# Patient Record
Sex: Female | Born: 1950
Health system: Southern US, Community
[De-identification: ages and names within clinical notes are randomized; demographics above are authoritative.]

## PROBLEM LIST (undated history)

## (undated) DIAGNOSIS — I8393 Asymptomatic varicose veins of bilateral lower extremities: Secondary | ICD-10-CM

## (undated) DIAGNOSIS — M48061 Spinal stenosis, lumbar region without neurogenic claudication: Secondary | ICD-10-CM

## (undated) DIAGNOSIS — I10 Essential (primary) hypertension: Secondary | ICD-10-CM

## (undated) DIAGNOSIS — H269 Unspecified cataract: Secondary | ICD-10-CM

## (undated) DIAGNOSIS — M1990 Unspecified osteoarthritis, unspecified site: Secondary | ICD-10-CM

## (undated) DIAGNOSIS — G5600 Carpal tunnel syndrome, unspecified upper limb: Secondary | ICD-10-CM

## (undated) DIAGNOSIS — F329 Major depressive disorder, single episode, unspecified: Secondary | ICD-10-CM

## (undated) HISTORY — DX: Essential (primary) hypertension: I10

## (undated) HISTORY — DX: Asymptomatic varicose veins of bilateral lower extremities: I83.93

## (undated) HISTORY — DX: Unspecified osteoarthritis, unspecified site: M19.90

## (undated) HISTORY — DX: Unspecified cataract: H26.9

## (undated) HISTORY — DX: Major depressive disorder, single episode, unspecified: F32.9

## (undated) HISTORY — DX: Carpal tunnel syndrome, unspecified upper limb: G56.00

## (undated) HISTORY — PX: CATARACT EXTRACTION: SUR2

## (undated) HISTORY — DX: Spinal stenosis, lumbar region without neurogenic claudication: M48.061

---

## 2002-12-03 ENCOUNTER — Encounter: Payer: Self-pay | Admitting: Emergency Medicine

## 2002-12-03 ENCOUNTER — Emergency Department (HOSPITAL_COMMUNITY): Admission: EM | Admit: 2002-12-03 | Discharge: 2002-12-03 | Payer: Self-pay | Admitting: Podiatry

## 2002-12-27 ENCOUNTER — Ambulatory Visit (HOSPITAL_COMMUNITY): Admission: RE | Admit: 2002-12-27 | Discharge: 2002-12-27 | Payer: Self-pay | Admitting: *Deleted

## 2003-01-20 ENCOUNTER — Encounter: Admission: RE | Admit: 2003-01-20 | Discharge: 2003-01-20 | Payer: Self-pay | Admitting: Family Medicine

## 2003-03-11 ENCOUNTER — Encounter: Admission: RE | Admit: 2003-03-11 | Discharge: 2003-03-11 | Payer: Self-pay | Admitting: Family Medicine

## 2003-04-07 ENCOUNTER — Emergency Department (HOSPITAL_COMMUNITY): Admission: EM | Admit: 2003-04-07 | Discharge: 2003-04-07 | Payer: Self-pay | Admitting: Emergency Medicine

## 2003-04-10 ENCOUNTER — Encounter: Admission: RE | Admit: 2003-04-10 | Discharge: 2003-05-01 | Payer: Self-pay | Admitting: Neurosurgery

## 2004-09-29 ENCOUNTER — Encounter: Admission: RE | Admit: 2004-09-29 | Discharge: 2004-09-29 | Payer: Self-pay | Admitting: Family Medicine

## 2004-10-07 ENCOUNTER — Encounter: Admission: RE | Admit: 2004-10-07 | Discharge: 2004-10-07 | Payer: Self-pay | Admitting: Family Medicine

## 2004-10-09 ENCOUNTER — Other Ambulatory Visit: Admission: RE | Admit: 2004-10-09 | Discharge: 2004-10-09 | Payer: Self-pay | Admitting: Family Medicine

## 2009-02-08 ENCOUNTER — Emergency Department (HOSPITAL_COMMUNITY): Admission: EM | Admit: 2009-02-08 | Discharge: 2009-02-08 | Payer: Self-pay | Admitting: Emergency Medicine

## 2009-02-12 ENCOUNTER — Encounter: Admission: RE | Admit: 2009-02-12 | Discharge: 2009-02-12 | Payer: Self-pay | Admitting: Chiropractic Medicine

## 2010-03-01 ENCOUNTER — Encounter: Payer: Self-pay | Admitting: Family Medicine

## 2015-02-28 ENCOUNTER — Ambulatory Visit: Payer: Medicaid Other | Attending: Family Medicine | Admitting: Family Medicine

## 2015-02-28 ENCOUNTER — Encounter: Payer: Self-pay | Admitting: Family Medicine

## 2015-02-28 VITALS — BP 157/90 | HR 73 | Temp 97.8°F | Resp 18 | Ht 61.0 in | Wt 186.0 lb

## 2015-02-28 DIAGNOSIS — M7660 Achilles tendinitis, unspecified leg: Secondary | ICD-10-CM | POA: Diagnosis not present

## 2015-02-28 DIAGNOSIS — I8390 Asymptomatic varicose veins of unspecified lower extremity: Secondary | ICD-10-CM | POA: Insufficient documentation

## 2015-02-28 DIAGNOSIS — G5600 Carpal tunnel syndrome, unspecified upper limb: Secondary | ICD-10-CM | POA: Diagnosis not present

## 2015-02-28 DIAGNOSIS — M25551 Pain in right hip: Secondary | ICD-10-CM | POA: Diagnosis not present

## 2015-02-28 DIAGNOSIS — I10 Essential (primary) hypertension: Secondary | ICD-10-CM

## 2015-02-28 DIAGNOSIS — I83893 Varicose veins of bilateral lower extremities with other complications: Secondary | ICD-10-CM | POA: Insufficient documentation

## 2015-02-28 DIAGNOSIS — G5601 Carpal tunnel syndrome, right upper limb: Secondary | ICD-10-CM | POA: Diagnosis not present

## 2015-02-28 DIAGNOSIS — M4802 Spinal stenosis, cervical region: Secondary | ICD-10-CM | POA: Diagnosis not present

## 2015-02-28 DIAGNOSIS — R51 Headache: Secondary | ICD-10-CM | POA: Insufficient documentation

## 2015-02-28 DIAGNOSIS — M48061 Spinal stenosis, lumbar region without neurogenic claudication: Secondary | ICD-10-CM

## 2015-02-28 DIAGNOSIS — I8393 Asymptomatic varicose veins of bilateral lower extremities: Secondary | ICD-10-CM

## 2015-02-28 DIAGNOSIS — Z131 Encounter for screening for diabetes mellitus: Secondary | ICD-10-CM | POA: Diagnosis not present

## 2015-02-28 DIAGNOSIS — F329 Major depressive disorder, single episode, unspecified: Secondary | ICD-10-CM | POA: Diagnosis not present

## 2015-02-28 DIAGNOSIS — M4806 Spinal stenosis, lumbar region: Secondary | ICD-10-CM | POA: Diagnosis not present

## 2015-02-28 DIAGNOSIS — F32A Depression, unspecified: Secondary | ICD-10-CM

## 2015-02-28 DIAGNOSIS — M7662 Achilles tendinitis, left leg: Secondary | ICD-10-CM

## 2015-02-28 HISTORY — DX: Carpal tunnel syndrome, unspecified upper limb: G56.00

## 2015-02-28 HISTORY — DX: Spinal stenosis, lumbar region without neurogenic claudication: M48.061

## 2015-02-28 HISTORY — DX: Depression, unspecified: F32.A

## 2015-02-28 LAB — COMPLETE METABOLIC PANEL WITH GFR
ALT: 20 U/L (ref 6–29)
AST: 19 U/L (ref 10–35)
Albumin: 4.4 g/dL (ref 3.6–5.1)
Alkaline Phosphatase: 71 U/L (ref 33–130)
BILIRUBIN TOTAL: 0.5 mg/dL (ref 0.2–1.2)
BUN: 9 mg/dL (ref 7–25)
CALCIUM: 8.7 mg/dL (ref 8.6–10.4)
CO2: 26 mmol/L (ref 20–31)
CREATININE: 0.44 mg/dL — AB (ref 0.50–0.99)
Chloride: 104 mmol/L (ref 98–110)
GFR, Est Non African American: >89 mL/min (ref >=60)
Glucose, Bld: 105 mg/dL — ABNORMAL HIGH (ref 65–99)
Potassium: 3.8 mmol/L (ref 3.5–5.3)
Sodium: 141 mmol/L (ref 135–146)
TOTAL PROTEIN: 7 g/dL (ref 6.1–8.1)

## 2015-02-28 LAB — LIPID PANEL
CHOLESTEROL: 207 mg/dL — AB (ref 125–200)
HDL: 33 mg/dL — ABNORMAL LOW (ref >=46)
LDL CALC: 125 mg/dL (ref <130)
TRIGLYCERIDES: 247 mg/dL — AB (ref <150)
Total CHOL/HDL Ratio: 6.3 Ratio — ABNORMAL HIGH (ref <=5.0)
VLDL: 49 mg/dL — ABNORMAL HIGH (ref <30)

## 2015-02-28 LAB — POCT GLYCOSYLATED HEMOGLOBIN (HGB A1C): Hemoglobin A1C: 5.5

## 2015-02-28 MED ORDER — MELOXICAM 7.5 MG PO TABS: 7.5000 mg | ORAL_TABLET | Freq: Every day | ORAL | Status: DC

## 2015-02-28 MED ORDER — TRAMADOL HCL 50 MG PO TABS: 50.0000 mg | ORAL_TABLET | Freq: Three times a day (TID) | ORAL | Status: DC | PRN

## 2015-02-28 MED ORDER — LISINOPRIL-HYDROCHLOROTHIAZIDE 20-25 MG PO TABS: 1.0000 | ORAL_TABLET | Freq: Every day | ORAL | Status: DC

## 2015-02-28 MED FILL — traMADol HCL 50 MG TABS: 50 | 10 days supply | Qty: 30 | Fill #0

## 2015-02-28 NOTE — Progress Notes (Signed)
Subjective:  Patient ID: Shelly Meyer, female    DOB: June 04, 1950  Age: 65 y.o. MRN: [id number]  CC: Establish Care   HPI Braylin Bergman is a 65 year old female with a history of hypertension, depression, carpal tunnel syndrome, spinal stenosis who comes into the clinic accompanied by her daughter to establish care. She is seen with the aid of a video interpreter. Complains of running out of her blood pressure medications for the last 1 month and has had a headache this morning which is throbbing but denies nausea or vomiting or blurry vision. She also has right hip pain which started this morning and makes it difficult to walk. Of note, medical history significant for spinal stenosis and she is not on any medications for this. She also has left posterior ankle pain which she has had for the last month.  She points to her varicose veins which she states she is not happy with.  Denies chest pains or shortness of breath.  No outpatient prescriptions prior to visit.   No facility-administered medications prior to visit.    ROS Review of Systems  Constitutional: Negative for activity change, appetite change and fatigue.  HENT: Negative for congestion, sinus pressure and sore throat.   Eyes: Negative for visual disturbance.  Respiratory: Negative for cough, chest tightness, shortness of breath and wheezing.   Cardiovascular: Negative for chest pain and palpitations.  Gastrointestinal: Negative for abdominal pain, constipation and abdominal distention.  Endocrine: Negative for polydipsia.  Genitourinary: Negative for dysuria and frequency.  Musculoskeletal:       See history of present illness  Skin: Negative for rash.  Neurological: Positive for headaches. Negative for tremors, light-headedness and numbness.  Hematological: Does not bruise/bleed easily.  Psychiatric/Behavioral: Negative for behavioral problems and agitation.    Objective:  BP 157/90 mmHg  Pulse 73  Temp(Src) 97.8  F (36.6 C) (Oral)  Resp 18  Ht 5\' 1"  (1.549 m)  Wt 186 lb (84.369 kg)  BMI 35.16 kg/m2  SpO2 96%  LMP  (LMP Unknown)  BP/Weight A999333  Systolic BP A999333  Diastolic BP 90  Wt. (Lbs) 186  BMI 35.16      Physical Exam  Constitutional: She is oriented to person, place, and time. She appears well-developed and well-nourished.  Cardiovascular: Normal rate, normal heart sounds and intact distal pulses.   No murmur heard. Pulmonary/Chest: Effort normal and breath sounds normal. She has no wheezes. She has no rales. She exhibits no tenderness.  Abdominal: Soft. Bowel sounds are normal. She exhibits no distension and no mass. There is no tenderness.  Musculoskeletal: Normal range of motion. She exhibits tenderness (tenderness on palpation of left Achilles tendon).  Tenderness on palpation of cervical and lumbosacral spine; negative straight leg raise bilaterally  Neurological: She is alert and oriented to person, place, and time.  Skin:  Varicose veins of the right leg     Assessment & Plan:   1. Screening for diabetes mellitus (DM) A1c is normal at 5.5 - HgB A1c  2. Essential hypertension Uncontrolled due to being out of her medications which could also explain headache Advised on low sodium diet - lisinopril-hydrochlorothiazide (PRINZIDE,ZESTORETIC) 20-25 MG tablet; Take 1 tablet by mouth daily.  Dispense: 90 tablet; Refill: 1 - COMPLETE METABOLIC PANEL WITH GFR - Lipid panel  3. Spinal stenosis in lumbar region This could explain right hip pain especially from the radiculopathy We'll reassess for improvement at next office visit and determine the need to see a spine specialist -  traMADol (ULTRAM) 50 MG tablet; Take 1 tablet (50 mg total) by mouth every 8 (eight) hours as needed.  Dispense: 30 tablet; Refill: 0  4. Carpal tunnel syndrome of right wrist Stable  5. Achilles tendinitis of left lower extremity - meloxicam (MOBIC) 7.5 MG tablet; Take 1 tablet (7.5 mg  total) by mouth daily.  Dispense: 30 tablet; Refill: 0  6. Varicose vein of leg We'll discuss management and next visit that she may need surgical intervention if she so desires  7. Depression Patient refuses to take antidepressants because the one she used in the past were sedating     Meds ordered this encounter  Medications  . lisinopril-hydrochlorothiazide (PRINZIDE,ZESTORETIC) 20-25 MG tablet    Sig: Take 1 tablet by mouth daily.    Dispense:  90 tablet    Refill:  1  . traMADol (ULTRAM) 50 MG tablet    Sig: Take 1 tablet (50 mg total) by mouth every 8 (eight) hours as needed.    Dispense:  30 tablet    Refill:  0  . meloxicam (MOBIC) 7.5 MG tablet    Sig: Take 1 tablet (7.5 mg total) by mouth daily.    Dispense:  30 tablet    Refill:  0    Follow-up: Return in about 3 weeks (around 03/21/2015), or if symptoms worsen or fail to improve, for Follow-up of hypertension and varicose veins.Arnoldo Morale MD

## 2015-02-28 NOTE — Patient Instructions (Signed)
Tendinitis de Aquiles (Achilles Tendinitis) La tendinitis de Aquiles es una inflamacin de la banda dura, con aspecto de cordn, que [facility] inferiores de la pierna con el taln (tendn de Aquiles). Tiene su origen en el uso excesivo de ese tendn y la articulacin involucrada.  CAUSAS La tendinitis de Aquiles puede ocurrir por los siguientes motivos:  Un aumento repentino del ejercicio fsico o la actividad (como correr).  La repeticin Ardelia Mems y otra vez de los mismos ejercicios o las actividades (Network engineer).  No precalentar los msculos de las pantorrillas antes de hacer ejercicio.  Hacer ejercicio con calzado que est desgastado o no es adecuado para este fin.  Tener artritis o una formacin sea en la parte posterior del hueso del taln, que puede rozar contra el tendn y Insurance underwriter. [place] Rocker SNTOMAS Los sntomas ms frecuentes son:  Aeronautical engineer parte posterior de la pierna, justo arriba del taln. Generalmente, el dolor empeora con el ejercicio y mejora con el reposo.  Rigidez o dolor en la parte posterior de la pierna, en especial por la maana.  Hinchazn de la piel sobre el tendn de Aquiles.  Dificultades para pararse en puntas de pie. A veces, el tendn de Aquiles se desgarra (se rompe). Los sntomas de ruptura del tendn de Aquiles pueden incluir lo siguiente:  Dolor intenso y repentino en la parte posterior de la pierna.  Dificultad para soportar el peso en el pie o caminar normalmente. DIAGNSTICO El diagnstico de tendinitis de Aquiles se har en funcin de los sntomas y de un examen fsico. Se puede hacer una radiografa para verificar si hay otra afeccin que est causando los sntomas. Se puede indicar una RM si su mdico sospecha que hay una rotura total del tendn, lo que se conoce como ruptura del tendn de Aquiles.  TRATAMIENTO  Generalmente, la tendinitis de Aquiles mejora con [employer]. La recuperacin total puede llevar semanas o meses. El  tratamiento se centra en tratar los sntomas y ayudar a la curacin de la lesin. INSTRUCCIONES PARA EL CUIDADO EN EL HOGAR   Ponga en reposo el tendn de Aquiles y evite las actividades que causan dolor.  Aplique hielo sobre la zona lesionada.  Ponga el hielo en una bolsa plstica.  Colquese una toalla entre la piel y la bolsa de hielo.  Deje el hielo durante 20 minutos, y aplquelo de 2 a 3 veces por Training and development officer.  Mientras el tendn le cause dolor, evite moverlo (todo rango de movimiento que no sea moderado). No reinicie los movimientos hasta que su mdico se lo indique. Comience gradualmente. No aumente los movimientos hasta el punto de Education officer, environmental. Si el dolor aparece, disminuya la actividad y contine con las medidas indicadas ms [place]. Aumente gradualmente las actividades que no le causan Office manager la actividad normal.  Haga ejercicios para fortalecer y Garment/textile technologist la flexibilidad de los msculos de la pantorrilla. Su mdico o el fisioterapeuta pueden recomendarle ejercicios para que haga.  Vndese el tobillo con una venda elstica o de otro tipo. Esto puede ayudar a Corporate treasurer excesivo del tendn. Su mdico le mostrar cmo vendarse correctamente el tobillo.  Utilice los medicamentos de venta libre o recetados para Glass blower/designer, Health and safety inspector o la fiebre, segn se lo indique el mdico. SOLICITE ATENCIN MDICA SI:   El dolor y la hinchazn [place], o el dolor es incontrolable con medicamentos.  Tiene sntomas nuevos o sin motivo, o Press photographer.  No puede mover los dedos  del pie o el pie.  Tiene calor e hinchazn en el pie.  Le sube la fiebre sin motivo. ASEGRESE DE QUE:   Comprende estas instrucciones.  Controlar su afeccin.  Recibir ayuda de inmediato si no mejora o si empeora.   Esta informacin no tiene Marine scientist el consejo del mdico. Asegrese de hacerle al mdico cualquier pregunta que tenga.   Document Released: 01/25/2005  Document Revised: 02/15/2014 Elsevier Interactive Patient Education [employer].

## 2015-02-28 NOTE — Progress Notes (Signed)
Patient having right hip pain, radiates down leg, currently at level 8, described as aching, and is constant.  Patient has had headache this morning since 2am, currently at level 9, described as throbbing, sharp, and "feels like my eyes are going to pop out".    Patient reports she can't walk well because she is having pain in posterior left ankle.   Patient complains of swelling in right leg and varicose veins.    Patient reports being out of lisinopril-hctz 20/25mg  for 1 week.

## 2015-04-08 ENCOUNTER — Encounter: Payer: Self-pay | Admitting: Clinical

## 2015-04-08 NOTE — Progress Notes (Signed)
Depression screen [facility] 2/9 02/28/2015 02/28/2015  Decreased Interest 3 0  Down, Depressed, Hopeless 0 0  PHQ - 2 Score 3 0  Altered sleeping 1 -  Tired, decreased energy 3 -  Change in appetite 0 -  Feeling bad or failure about yourself  0 -  Trouble concentrating 3 -  Moving slowly or fidgety/restless 0 -  Suicidal thoughts 0 -  PHQ-9 Score 10 -    GAD 7 : Generalized Anxiety Score 02/28/2015  Nervous, Anxious, on Edge 0  Control/stop worrying 3  Worry too much - different things 0  Trouble relaxing 0  Restless 0  Easily annoyed or irritable 2  Afraid - awful might happen 0  Total GAD 7 Score 5

## 2015-07-09 ENCOUNTER — Encounter (HOSPITAL_COMMUNITY): Payer: Self-pay | Admitting: Emergency Medicine

## 2015-07-09 ENCOUNTER — Ambulatory Visit (HOSPITAL_COMMUNITY)
Admission: EM | Admit: 2015-07-09 | Discharge: 2015-07-09 | Disposition: A | Discharge status: Nursing Home (Includes ICF) | Payer: Medicare Other | Attending: Family Medicine | Admitting: Family Medicine

## 2015-07-09 ENCOUNTER — Emergency Department (HOSPITAL_COMMUNITY)
Admission: EM | Admit: 2015-07-09 | Discharge: 2015-07-10 | Disposition: A | Discharge status: Home / Self Care | Payer: Medicare Other | Attending: Emergency Medicine | Admitting: Emergency Medicine

## 2015-07-09 DIAGNOSIS — F329 Major depressive disorder, single episode, unspecified: Secondary | ICD-10-CM | POA: Diagnosis not present

## 2015-07-09 DIAGNOSIS — R109 Unspecified abdominal pain: Secondary | ICD-10-CM | POA: Insufficient documentation

## 2015-07-09 DIAGNOSIS — M549 Dorsalgia, unspecified: Secondary | ICD-10-CM | POA: Diagnosis present

## 2015-07-09 DIAGNOSIS — K5730 Diverticulosis of large intestine without perforation or abscess without bleeding: Secondary | ICD-10-CM | POA: Diagnosis not present

## 2015-07-09 DIAGNOSIS — Z79899 Other long term (current) drug therapy: Secondary | ICD-10-CM | POA: Insufficient documentation

## 2015-07-09 DIAGNOSIS — M1990 Unspecified osteoarthritis, unspecified site: Secondary | ICD-10-CM | POA: Insufficient documentation

## 2015-07-09 DIAGNOSIS — M545 Low back pain, unspecified: Secondary | ICD-10-CM

## 2015-07-09 DIAGNOSIS — Z8669 Personal history of other diseases of the nervous system and sense organs: Secondary | ICD-10-CM | POA: Diagnosis not present

## 2015-07-09 DIAGNOSIS — M542 Cervicalgia: Secondary | ICD-10-CM | POA: Insufficient documentation

## 2015-07-09 DIAGNOSIS — R531 Weakness: Secondary | ICD-10-CM | POA: Insufficient documentation

## 2015-07-09 DIAGNOSIS — M25511 Pain in right shoulder: Secondary | ICD-10-CM | POA: Insufficient documentation

## 2015-07-09 DIAGNOSIS — R52 Pain, unspecified: Secondary | ICD-10-CM

## 2015-07-09 DIAGNOSIS — I10 Essential (primary) hypertension: Secondary | ICD-10-CM | POA: Diagnosis not present

## 2015-07-09 DIAGNOSIS — R0602 Shortness of breath: Secondary | ICD-10-CM | POA: Insufficient documentation

## 2015-07-09 LAB — URINALYSIS, ROUTINE W REFLEX MICROSCOPIC
Bilirubin Urine: NEGATIVE
Glucose, UA: NEGATIVE mg/dL
Ketones, ur: NEGATIVE mg/dL
Nitrite: NEGATIVE
Protein, ur: NEGATIVE mg/dL
Specific Gravity, Urine: 1.014 (ref 1.005–1.030)
pH: 7.5 (ref 5.0–8.0)

## 2015-07-09 LAB — URINE MICROSCOPIC-ADD ON

## 2015-07-09 MED ORDER — OXYCODONE-ACETAMINOPHEN 5-325 MG PO TABS
1.0000 | ORAL_TABLET | Freq: Once | ORAL | Status: AC
  Administered 2015-07-09: 1 via ORAL
  Filled 2015-07-09: qty 1

## 2015-07-09 NOTE — ED Notes (Signed)
The patient presented to the [facility] with a complaint of left shoulder and arm and back pain x 3 weeks. The patient denied any known injuries.

## 2015-07-09 NOTE — ED Provider Notes (Signed)
CSN: [id number]     Arrival date & time 07/09/15  1956 History  By signing my name below, I, Higinio Plan, attest that this documentation has been prepared under the direction and in the presence of Delora Fuel, MD . Electronically Signed: Higinio Plan, Scribe. 07/09/2015. 11:45 PM.    Chief Complaint  Patient presents with  . Back Pain   The history is provided by the patient. A language interpreter was used.   HPI Comments:  Shelly Meyer is a 65 y.o. female with PMHx of osteoarthritis and HTN, who presents to the Emergency Department complaining of constant, gradually worsening, right flank pain with associated weakness in extremities that began yesterday. Pt states that her pain is radiating out towards her right arm, fingers, hips and right achilles tendon. Pt reports that her pain began after she tried moving a TV earlier in the week. Pt states that she caught the TV as it began to fall and felt pain in her back immediately after. Pt states that makes her pain worsened during certain movements like sitting down, walking up steps, and wiping after using the bathroom. Pt states that she has taken 650 mg Tylenol and Tramadol to alleviate her symptoms with no relief. Pt denies trouble urinating, bowel and bladder incontinence, and subjective fever.    Past Medical History  Diagnosis Date  . Osteoarthritis   . Hypertension   . Carpal tunnel syndrome 02/28/2015  . Spinal stenosis of lumbar region 02/28/2015  . Depression 02/28/2015   History reviewed. No pertinent past surgical history. No family history on file. Social History  Substance Use Topics  . Smoking status: Never Smoker   . Smokeless tobacco: None  . Alcohol Use: No   OB History    No data available     Review of Systems  Constitutional: Negative for fever.  Respiratory: Positive for shortness of breath.   Genitourinary: Positive for flank pain. Negative for difficulty urinating.  Musculoskeletal: Positive for myalgias and  back pain.  Neurological: Positive for weakness.  All other systems reviewed and are negative.  Allergies  Review of patient's allergies indicates no known allergies.  Home Medications   Prior to Admission medications   Medication Sig Start Date End Date Taking? Authorizing Provider  lisinopril-hydrochlorothiazide (PRINZIDE,ZESTORETIC) 20-25 MG tablet Take 1 tablet by mouth daily. 02/28/15   Arnoldo Morale, MD  meloxicam (MOBIC) 7.5 MG tablet Take 1 tablet (7.5 mg total) by mouth daily. 02/28/15   Arnoldo Morale, MD  traMADol (ULTRAM) 50 MG tablet Take 1 tablet (50 mg total) by mouth every 8 (eight) hours as needed. 02/28/15   Arnoldo Morale, MD   Triage Vitals: BP 139/82 mmHg  Pulse 69  Temp(Src) 98.2 F (36.8 C) (Oral)  Resp 18  SpO2 98%  LMP  (LMP Unknown) Physical Exam  Constitutional: She is oriented to person, place, and time. She appears well-developed and well-nourished.  HENT:  Head: Normocephalic and atraumatic.  Eyes: Conjunctivae and EOM are normal. Pupils are equal, round, and reactive to light. Right eye exhibits no discharge. Left eye exhibits no discharge.  Neck: Normal range of motion. Neck supple. No JVD present.  Cardiovascular: Normal rate, regular rhythm and normal heart sounds.   No murmur heard. Pulmonary/Chest: Effort normal and breath sounds normal. She has no wheezes. She has no rales. She exhibits no tenderness.  Abdominal: Soft. Bowel sounds are normal. She exhibits no distension and no mass. There is no tenderness.  Musculoskeletal: Normal range of motion. She exhibits  tenderness. She exhibits no edema.  Mild tenderness diffusely in neck FROM  Back: mild tenderness in right scapular and right costovertebral area  FROM of all joints with no apparent pain  Lymphadenopathy:    She has no cervical adenopathy.  Neurological: She is alert and oriented to person, place, and time. No cranial nerve deficit. She exhibits normal muscle tone. Coordination normal.   Skin: Skin is warm and dry. No rash noted.  Psychiatric: She has a normal mood and affect.  Nursing note and vitals reviewed.   ED Course  Procedures  DIAGNOSTIC STUDIES:  Oxygen Saturation is 98% on RA, normal by my interpretation.    COORDINATION OF CARE:  11:35 PM Discussed treatment plan with pt which includes blood tests, pain medication, and CT renal  at bedside and pt agreed to plan. Pt has been advised to follow-up with her PCP.   Labs Review Results for orders placed or performed during the hospital encounter of 07/09/15  Urinalysis, Routine w reflex microscopic- may I&O cath if menses  Result Value Ref Range   Color, Urine YELLOW YELLOW   APPearance CLEAR CLEAR   Specific Gravity, Urine 1.014 1.005 - 1.030   pH 7.5 5.0 - 8.0   Glucose, UA NEGATIVE NEGATIVE mg/dL   Hgb urine dipstick TRACE (A) NEGATIVE   Bilirubin Urine NEGATIVE NEGATIVE   Ketones, ur NEGATIVE NEGATIVE mg/dL   Protein, ur NEGATIVE NEGATIVE mg/dL   Nitrite NEGATIVE NEGATIVE   Leukocytes, UA SMALL (A) NEGATIVE  Urine microscopic-add on  Result Value Ref Range   Squamous Epithelial / LPF 0-5 (A) NONE SEEN   WBC, UA 0-5 0 - 5 WBC/hpf   RBC / HPF 0-5 0 - 5 RBC/hpf   Bacteria, UA RARE (A) NONE SEEN  Basic metabolic panel  Result Value Ref Range   Sodium 136 135 - 145 mmol/L   Potassium 3.3 (L) 3.5 - 5.1 mmol/L   Chloride 102 101 - 111 mmol/L   CO2 26 22 - 32 mmol/L   Glucose, Bld 101 (H) 65 - 99 mg/dL   BUN 10 6 - 20 mg/dL   Creatinine, Ser 0.45 0.44 - 1.00 mg/dL   Calcium 9.0 8.9 - 10.3 mg/dL   GFR calc non Af Amer >60 >60 mL/min   GFR calc Af Amer >60 >60 mL/min   Anion gap 8 5 - 15  CBC with Differential  Result Value Ref Range   WBC 7.4 4.0 - 10.5 K/uL   RBC 4.51 3.87 - 5.11 MIL/uL   Hemoglobin 13.2 12.0 - 15.0 g/dL   HCT 39.7 36.0 - 46.0 %   MCV 88.0 78.0 - 100.0 fL   MCH 29.3 26.0 - 34.0 pg   MCHC 33.2 30.0 - 36.0 g/dL   RDW 12.4 11.5 - 15.5 %   Platelets 246 150 - 400 K/uL    Neutrophils Relative % 47 %   Neutro Abs 3.5 1.7 - 7.7 K/uL   Lymphocytes Relative 43 %   Lymphs Abs 3.2 0.7 - 4.0 K/uL   Monocytes Relative 7 %   Monocytes Absolute 0.5 0.1 - 1.0 K/uL   Eosinophils Relative 3 %   Eosinophils Absolute 0.2 0.0 - 0.7 K/uL   Basophils Relative 0 %   Basophils Absolute 0.0 0.0 - 0.1 K/uL  Sedimentation rate  Result Value Ref Range   Sed Rate 18 0 - 22 mm/hr   Imaging Review Ct Renal Stone Study  07/10/2015  CLINICAL DATA:  Subacute onset of right  lower back pain. Initial encounter. EXAM: CT ABDOMEN AND PELVIS WITHOUT CONTRAST TECHNIQUE: Multidetector CT imaging of the abdomen and pelvis was performed following the standard protocol without IV contrast. COMPARISON:  CT of the abdomen and pelvis performed 04/07/2003 FINDINGS: Minimal bibasilar atelectasis is noted. Scattered coronary artery calcification is noted. The liver and spleen are unremarkable in appearance. The gallbladder is within normal limits. The pancreas and adrenal glands are unremarkable. The kidneys are unremarkable in appearance. There is no evidence of hydronephrosis. No renal or ureteral stones are seen. No perinephric stranding is appreciated. No free fluid is identified. The small bowel is unremarkable in appearance. The stomach is within normal limits. No acute vascular abnormalities are seen. Scattered calcification is seen along the abdominal aorta and its branches. The appendix is normal in caliber and contains air, without evidence of appendicitis. Scattered diverticulosis is noted along the proximal sigmoid colon, without evidence of diverticulitis. The bladder is relatively decompressed and not well assessed. The uterus is grossly unremarkable in appearance. The ovaries are relatively symmetric. No suspicious adnexal masses are seen. No inguinal lymphadenopathy is seen. No acute osseous abnormalities are identified. IMPRESSION: 1. No acute abnormality seen within the abdomen or pelvis. 2.  Scattered coronary artery calcification noted. 3. Scattered calcification along the abdominal aorta and its branches. 4. Scattered diverticulosis along the proximal sigmoid colon, without evidence of diverticulitis. Electronically Signed   By: Garald Balding M.D.   On: 07/10/2015 02:11   I have personally reviewed and evaluated these images and lab results as part of my medical decision-making.   MDM   Final diagnoses:  Body aches    Body aches in pattern that is atypical. Her major complaint seem to be around the right flank, so she will be sent for renal stone protocol CT scan. Also, will screen for polymyalgia rheumatica with sedimentation rate. She's given a dose of oxycodone have acetaminophen. Old records are reviewed and she was noted to have gone to urgent care center earlier today. CT abdomen and pelvis in 2005 showed no evidence of abdominal aneurysm. Lumbar spine films in 2011 were significant only for osteoporosis. CT scan today showed no acute process and no evidence of renal calculi or hydronephrosis. Laboratory workup was unremarkable. Sedimentation rate came back at 18, which is normal and not consistent with polymyalgia rheumatica. She is given reassurance of the negative workup and is sent home with prescription for oxycodone-acetaminophen.She is to follow-up with her PCP.  I personally performed the services described in this documentation, which was scribed in my presence. The recorded information has been reviewed and is accurate.      Delora Fuel, MD [id number]

## 2015-07-09 NOTE — ED Notes (Signed)
C/o R lower back pain x 3 weeks that is worse with movement.  Also reports urine smells like "ammonia."

## 2015-07-09 NOTE — ED Provider Notes (Signed)
CSN: [id number]     Arrival date & time 07/09/15  [id number] History   First MD Initiated Contact with Patient 07/09/15 1925     Chief Complaint  Patient presents with  . Shoulder Pain  . Back Pain   (Consider location/radiation/quality/duration/timing/severity/associated sxs/prior Treatment) Patient is a 65 y.o. female presenting with back pain. The history is provided by the patient.  Back Pain Location:  Lumbar spine Quality:  Burning (right low back pain, epigastric pain, headaches.) Radiates to:  Does not radiate Pain severity:  Moderate Onset quality:  Gradual Duration:  3 weeks Progression:  Worsening Chronicity:  New Ineffective treatments: no relief from tramadol. Associated symptoms: abdominal pain and headaches   Associated symptoms: no abdominal swelling, no chest pain, no fever and no leg pain     Past Medical History  Diagnosis Date  . Osteoarthritis   . Hypertension   . Carpal tunnel syndrome 02/28/2015  . Spinal stenosis of lumbar region 02/28/2015  . Depression 02/28/2015   History reviewed. No pertinent past surgical history. History reviewed. No pertinent family history. Social History  Substance Use Topics  . Smoking status: Never Smoker   . Smokeless tobacco: None  . Alcohol Use: No   OB History    No data available     Review of Systems  Constitutional: Negative.  Negative for fever.  Cardiovascular: Negative for chest pain.  Gastrointestinal: Positive for abdominal pain.  Genitourinary: Negative.   Musculoskeletal: Positive for myalgias and back pain. Negative for joint swelling and gait problem.  Skin: Negative.   Neurological: Positive for headaches.  All other systems reviewed and are negative.   Allergies  Review of patient's allergies indicates no known allergies.  Home Medications   Prior to Admission medications   Medication Sig Start Date End Date Taking? Authorizing Provider  lisinopril-hydrochlorothiazide (PRINZIDE,ZESTORETIC)  20-25 MG tablet Take 1 tablet by mouth daily. 02/28/15  Yes Arnoldo Morale, MD  meloxicam (MOBIC) 7.5 MG tablet Take 1 tablet (7.5 mg total) by mouth daily. 02/28/15   Arnoldo Morale, MD  traMADol (ULTRAM) 50 MG tablet Take 1 tablet (50 mg total) by mouth every 8 (eight) hours as needed. 02/28/15   Arnoldo Morale, MD   Meds Ordered and Administered this Visit  Medications - No data to display  BP 151/76 mmHg  Pulse 71  Temp(Src) 98.4 F (36.9 C) (Oral)  Resp 16  SpO2 100%  LMP  (LMP Unknown) No data found.   Physical Exam  Constitutional: She is oriented to person, place, and time. She appears well-developed and well-nourished.  Eyes: Pupils are equal, round, and reactive to light.  Neck: Normal range of motion. Neck supple.  Cardiovascular: Normal rate, normal heart sounds and intact distal pulses.   Pulmonary/Chest: Effort normal and breath sounds normal.  Musculoskeletal: Normal range of motion. She exhibits tenderness.  Lumbar back and right arm pain  Lymphadenopathy:    She has no cervical adenopathy.  Neurological: She is alert and oriented to person, place, and time. No cranial nerve deficit.  Skin: Skin is warm and dry.  Nursing note and vitals reviewed.   ED Course  Procedures (including critical care time)  Labs Review Labs Reviewed - No data to display  Imaging Review No results found.   Visual Acuity Review  Right Eye Distance:   Left Eye Distance:   Bilateral Distance:    Right Eye Near:   Left Eye Near:    Bilateral Near:  MDM   1. Acute lumbar back pain    Sent for eval of complaints.    Billy Fischer, MD 07/09/15 [phone]

## 2015-07-10 ENCOUNTER — Emergency Department (HOSPITAL_COMMUNITY): Payer: Medicare Other

## 2015-07-10 DIAGNOSIS — K5730 Diverticulosis of large intestine without perforation or abscess without bleeding: Secondary | ICD-10-CM | POA: Diagnosis not present

## 2015-07-10 DIAGNOSIS — M25511 Pain in right shoulder: Secondary | ICD-10-CM | POA: Diagnosis not present

## 2015-07-10 LAB — BASIC METABOLIC PANEL
ANION GAP: 8 (ref 5–15)
BUN: 10 mg/dL (ref 6–20)
CHLORIDE: 102 mmol/L (ref 101–111)
CO2: 26 mmol/L (ref 22–32)
Calcium: 9 mg/dL (ref 8.9–10.3)
Creatinine, Ser: 0.45 mg/dL (ref 0.44–1.00)
GFR calc Af Amer: >60 mL/min (ref >60)
GLUCOSE: 101 mg/dL — AB (ref 65–99)
POTASSIUM: 3.3 mmol/L — AB (ref 3.5–5.1)
Sodium: 136 mmol/L (ref 135–145)

## 2015-07-10 LAB — CBC WITH DIFFERENTIAL/PLATELET
BASOS ABS: 0 10*3/uL (ref 0.0–0.1)
Basophils Relative: 0 %
EOS PCT: 3 %
Eosinophils Absolute: 0.2 10*3/uL (ref 0.0–0.7)
HEMATOCRIT: 39.7 % (ref 36.0–46.0)
Hemoglobin: 13.2 g/dL (ref 12.0–15.0)
LYMPHS ABS: 3.2 10*3/uL (ref 0.7–4.0)
LYMPHS PCT: 43 %
MCH: 29.3 pg (ref 26.0–34.0)
MCHC: 33.2 g/dL (ref 30.0–36.0)
MCV: 88 fL (ref 78.0–100.0)
MONO ABS: 0.5 10*3/uL (ref 0.1–1.0)
Monocytes Relative: 7 %
NEUTROS ABS: 3.5 10*3/uL (ref 1.7–7.7)
Neutrophils Relative %: 47 %
Platelets: 246 10*3/uL (ref 150–400)
RBC: 4.51 MIL/uL (ref 3.87–5.11)
RDW: 12.4 % (ref 11.5–15.5)
WBC: 7.4 10*3/uL (ref 4.0–10.5)

## 2015-07-10 LAB — SEDIMENTATION RATE: Sed Rate: 18 mm/hr (ref 0–22)

## 2015-07-10 MED ORDER — OXYCODONE-ACETAMINOPHEN 5-325 MG PO TABS: 1.0000 | ORAL_TABLET | ORAL | Status: DC | PRN

## 2015-07-10 NOTE — Discharge Instructions (Signed)
Su evaluacin no mostr la causa de su dolor, pero no hay signos de ninguna condicin grave. Contine tomando acetaminofn e ibuprofeno segn sea necesario para un dolor menos severo.  Acetaminophen; Oxycodone tablets Qu es este medicamento? ACETAMINOFENO; OXICODONA es un analgsico. Se utiliza para tratar los dolores moderados a severos. Este medicamento puede ser utilizado para otros usos; si tiene alguna pregunta consulte con su proveedor de atencin mdica o con su farmacutico. Qu le debo informar a mi profesional de la salud antes de tomar este medicamento? Necesita saber si usted presenta alguno de los [employer] o situaciones: -tumor cerebral -enfermedad de Crohn, enfermedad intestinal inflamatoria o colitis ulcerativa -abuso de drogas o drogadiccin -lesin de la cabeza -problemas cardiacos o circulatorios -si consume alcohol con frecuencia -enfermedad renal o problemas al orinar -enfermedad heptica -enfermedad pulmonar, asma o dificultades al respirar -una reaccin alrgica o inusual al acetaminofeno, a la oxicodona, a otros analgsicos opiceos, a otros medicamentos, alimentos, colorantes o conservantes -si est embarazada o buscando quedar embarazada -si est amamantando a un beb Cmo debo utilizar este medicamento? Tome este medicamento por va oral con un vaso lleno de agua. Siga las instrucciones de la etiqueta del [place]. Usted puede tomar Coca-Cola con o sin alimentos. Si le produce malestar estomacal, tmelo con alimentos. Tome su medicamento a intervalos regulares. No tome su medicamento con una frecuencia mayor que la indicada. Hable con su pediatra para informarse acerca del uso de este medicamento en nios. Puede requerir Sales executive. Los pacientes de ms de 65 aos de edad pueden presentar reacciones ms fuertes a Fish farm manager y Designer, industrial/product dosis menores. Sobredosis: Pngase en contacto inmediatamente con un centro toxicolgico o  una sala de urgencia si usted cree que haya tomado demasiado medicamento. ATENCIN: [employer] es solo para usted. No comparta este medicamento con nadie. Qu sucede si me olvido de una dosis? Si olvida una dosis, tmela lo antes posible. Si es casi la hora de la prxima dosis, tome slo esa dosis. No tome dosis adicionales o dobles. Qu puede interactuar con este medicamento? -alcohol -antihistamnicos -barbitricos tales como el amobarbital, butalbital, butabarbital, metohexital, pentobarbital, fenobarbital, tiopental y secobarbital -benztropina -medicamentos para problemas de vejiga, tales como solifenacina, trospium, oxibutinina, tolterodina, hiosciamina y metscopolamina -medicamentos para problemas respiratorios, tales como ipratropio y tiotropio -medicamentos para ciertos problemas estomacales o intestinales, tales como propantelina, homatropina metilbromuro, Sports administrator, atropina, belladona y diciclomina -anestsicos generales, tales como etomidato, [place], xido nitroso, propofol, desflurano, enflurano, halotano, isoflurano y sevoflurano -medicamentos para la depresin, ansiedad o trastornos psicticos -medicamentos para dormir -relajantes musculares -naltrexona -medicamentos narcticos (opiceos) para Conservation officer, historic buildings -fenotiazinas, tales como perfenacina, tioridazina, clorpromacina, mesoridazina, flufenazina, proclorperazina, promazina y trifluoperazina -escopolamina -tramadol -trihexifenidilo Puede ser que esta lista no menciona todas las posibles interacciones. Informe a su profesional de [employer]	[place] de [employer] productos a base de hierbas, medicamentos de [place] o suplementos nutritivos que est tomando. Si usted fuma, consume bebidas alcohlicas o si utiliza drogas ilegales, indqueselo tambin a su profesional de [employer]	[place]. Algunas sustancias pueden interactuar con su medicamento. A qu debo estar atento al usar Coca-Cola? Si el dolor no desaparece, si empeora o  si experimenta un dolor nuevo o de tipo diferente, consulte a su mdico o a su profesional de [employer]	[place]. Usted puede desarrollar tolerancia al medicamento. La tolerancia significa que necesitar una dosis ms alta para Best boy. Tolerancia es normal y esperada cuando est tomando este medicamento por un largo perodo de [place]. No suspenda el uso  de su medicamento repentinamente debido a que Engineer, building services severa. Su cuerpo se acostumbra a Fish farm manager. Esto NO significa que sea adicto. La adiccin es un comportamiento que hace referencia a la obtencin y utilizacin de un medicamento con fines que no son mdicos. Si tiene Social research officer, government, existe una razn mdica para que usted tome un analgsico. Su mdico le indicar la cantidad de medicamento que Tree surgeon. Si su mdico desea que [employer], la dosis ser reducida gradualmente para Research officer, political party secundarios. Puede experimentar somnolencia o mareos. No conduzca ni utilice maquinaria ni haga nada que Associate Professor en estado de alerta hasta que sepa cmo le afecta este medicamento. No se siente ni se ponga de pie con rapidez, especialmente si es un paciente de edad avanzada. Esto reduce el riesgo de mareos o [employer]. El alcohol puede interferir con el efecto de este medicamento. Evite consumir bebidas alcohlicas. Hay distintos tipos de medicamentos narcticos (opiceos) para Conservation officer, historic buildings. Si usted toma ms que un tipo a la [employer], podr tener ms [employer]. Dar a su proveedor de atencin medica una lista de todos los medicamentos que usted [place]. Su mdico le informar la cantidad de medicamento que Aeronautical engineer. No tome ms medicamento que lo indicado. Comunquese con emergencia para ayuda si tiene problemas para respirar. Este medicamento causar estreimiento. Trate de evacuar los intestinos al menos cada 2  3 das. Si no evacua los intestinos durante 3 das, comunquese con su mdico o con su profesional de  [employer]	[place]. No tome Tylenol (acetaminofeno) u otros medicamentos que contienen acetaminofeno con este medicamento. Tomando mucho acetaminofeno puede ser muy peligroso. Muchos medicamentos de venta libre contienen acetaminofeno. Lea siempre las etiquetas cuidadosamente para evitar el tomar ms acetaminofeno. Qu efectos secundarios puedo tener al [employer] este medicamento? Efectos secundarios que debe informar a su mdico o a Barrister's clerk de la salud tan pronto como sea posible: -Scientist, clinical (histocompatibility and immunogenetics), tales como erupcin cutnea, picazn o urticarias, hinchazn de la cara, labios o lengua -dificultades respiratorias, sibilancias -confusin -sensacin de desmayos o aturdimiento -dolor de estmago severo -cansancio o debilidad inusual -color amarillento de los ojos o la piel Efectos secundarios que, por lo general, no requieren atencin mdica (debe informarlos a su mdico o a su profesional de la salud si persisten o si son molestos): -mareos -somnolencia -nuseas -vmitos Puede ser que esta lista no menciona todos los posibles efectos secundarios. Comunquese a su mdico por asesoramiento mdico [employer]. Usted puede informar los efectos secundarios a la FDA por telfono al 1-800-FDA-1088. Dnde debo guardar mi medicina? Mantngala fuera del alcance de los nios. Este medicamento puede ser abusado. Mantenga su medicamento en un lugar seguro para protegerlo contra robos. No comparta este medicamento con nadie. Es peligroso vender o ceder este medicamento y est prohibido por la ley. Gurdelo a [employer], entre 20 y 10 grados C (83 y 83 grados F). Mantenga el envase bien cerrado. Protjalo de Naval architect. Este medicamento puede causar muerte y sobredosis accidental si es tomado por otros adultos, nios o Copy. Tire los medicamentos que no haya utilizado al inodoro para reducir la posibilidad de dao. No use el medicamento despus de la fecha de  vencimiento. ATENCIN: Este folleto es un resumen. Puede ser que no cubra toda la posible informacin. Si usted tiene preguntas acerca de esta medicina, consulte con su mdico, su farmacutico o su profesional de Technical sales engineer.    2016, Elsevier/Gold Standard. (2014-03-19 00:00:00)

## 2015-07-10 NOTE — ED Notes (Signed)
Patient transported to CT 

## 2015-08-24 ENCOUNTER — Ambulatory Visit (HOSPITAL_COMMUNITY)
Admission: EM | Admit: 2015-08-24 | Discharge: 2015-08-24 | Disposition: A | Discharge status: Home / Self Care | Payer: Medicare Other | Attending: Family Medicine | Admitting: Family Medicine

## 2015-08-24 ENCOUNTER — Encounter (HOSPITAL_COMMUNITY): Payer: Self-pay | Admitting: Emergency Medicine

## 2015-08-24 ENCOUNTER — Ambulatory Visit (INDEPENDENT_AMBULATORY_CARE_PROVIDER_SITE_OTHER): Payer: Medicare Other

## 2015-08-24 DIAGNOSIS — S6992XA Unspecified injury of left wrist, hand and finger(s), initial encounter: Secondary | ICD-10-CM

## 2015-08-24 DIAGNOSIS — M79642 Pain in left hand: Secondary | ICD-10-CM | POA: Diagnosis not present

## 2015-08-24 NOTE — ED Notes (Signed)
The patient presented to the [facility] with a complaint of left hand pain and swelling secondary to a fall that occurred 3 days ago.

## 2015-08-24 NOTE — ED Provider Notes (Signed)
CSN: [id number]     Arrival date & time 08/24/15  1443 History   First MD Initiated Contact with Patient 08/24/15 1645     Chief Complaint  Patient presents with  . Fall  . Hand Injury   (Consider location/radiation/quality/duration/timing/severity/associated sxs/prior Treatment) Patient is a 65 y.o. female presenting with fall. The history is provided by the patient.  Fall This is a new problem. Episode onset: fell on outstretch hand 3 days ago. The problem occurs constantly. The problem has been gradually worsening. Associated symptoms comments: Left hand and finger pain, she is unable to move her middle finger. Pain is about 8/10. Pain radiates to the left elbow. She uses Tramadol as needed for pain, it helps only a little. She used warm compression on her hand as well.. The symptoms are aggravated by bending and twisting. Nothing relieves the symptoms. The treatment provided no relief.    Past Medical History  Diagnosis Date  . Osteoarthritis   . Hypertension   . Carpal tunnel syndrome 02/28/2015  . Spinal stenosis of lumbar region 02/28/2015  . Depression 02/28/2015   History reviewed. No pertinent past surgical history. History reviewed. No pertinent family history. Social History  Substance Use Topics  . Smoking status: Never Smoker   . Smokeless tobacco: None  . Alcohol Use: No   OB History    No data available     Review of Systems  Respiratory: Negative.   Cardiovascular: Negative.   Musculoskeletal: Positive for arthralgias.       Left hand pain  All other systems reviewed and are negative.   Allergies  Review of patient's allergies indicates no known allergies.  Home Medications   Prior to Admission medications   Medication Sig Start Date End Date Taking? Authorizing Provider  lisinopril-hydrochlorothiazide (PRINZIDE,ZESTORETIC) 20-25 MG tablet Take 1 tablet by mouth daily. 02/28/15  Yes Arnoldo Morale, MD  traMADol (ULTRAM) 50 MG tablet Take 1 tablet (50 mg  total) by mouth every 8 (eight) hours as needed. 02/28/15  Yes Arnoldo Morale, MD  meloxicam (MOBIC) 7.5 MG tablet Take 1 tablet (7.5 mg total) by mouth daily. 02/28/15   Arnoldo Morale, MD  oxyCODONE-acetaminophen (PERCOCET) 5-325 MG tablet Take 1 tablet by mouth every 4 (four) hours as needed for moderate pain. [id number]   Delora Fuel, MD   Meds Ordered and Administered this Visit  Medications - No data to display  BP 116/70 mmHg  Pulse 70  Temp(Src) 98.4 F (36.9 C) (Oral)  Resp 20  SpO2 96%  LMP  (LMP Unknown) No data found.   Physical Exam  Constitutional: She appears well-developed. No distress.  Cardiovascular: Normal rate, regular rhythm and normal heart sounds.   No murmur heard. Pulmonary/Chest: Effort normal and breath sounds normal. No respiratory distress. She has no wheezes.  Musculoskeletal:       Right shoulder: Normal.       Left shoulder: Normal.       Right elbow: Normal.      Left elbow: Normal.       Right wrist: Normal.       Left wrist: She exhibits normal range of motion, no tenderness, no bony tenderness and no swelling.       Left hand: She exhibits decreased range of motion, tenderness, decreased capillary refill and swelling. She exhibits no laceration. Normal sensation noted. Decreased strength noted.       Hands: Nursing note and vitals reviewed.   ED Course  Procedures (including critical care time)  Labs Review Labs Reviewed - No data to display  Imaging Review No results found.   Visual Acuity Review  Right Eye Distance:   Left Eye Distance:   Bilateral Distance:    Right Eye Near:   Left Eye Near:    Bilateral Near:      Dg Hand Complete Left  08/24/2015  CLINICAL DATA:  65 year old female with acute left hand pain following fall. Initial encounter. EXAM: LEFT HAND - COMPLETE 3+ VIEW COMPARISON:  None. FINDINGS: No acute fracture, subluxation or dislocation identified. No focal bony lesions are noted. No radiopaque foreign bodies are  identified. IMPRESSION: No acute bony abnormality. Electronically Signed   By: Margarette Canada M.D.   On: 08/24/2015 17:38     MDM  No diagnosis found. Hand injury, left, initial encounter  No acute finding on xray. Patient reassured this should resolve in few more days. Use warm massage at home. Continue Tramadol as needed. Hand brace applied to limit mobility for 24- 48 hours and then start graded ROM exercise. F/U as needed.    Kinnie Feil, MD 08/24/15 1754

## 2015-08-24 NOTE — Discharge Instructions (Signed)
It was nice seeing you today. Your hand xray is normal. Your hand swelling and pain is due to inflammation from your injury and it should resolve soon. Continue tramadol as needed for pain.

## 2015-08-26 ENCOUNTER — Emergency Department (HOSPITAL_COMMUNITY)
Admission: EM | Admit: 2015-08-26 | Discharge: 2015-08-26 | Disposition: A | Discharge status: Home / Self Care | Payer: Medicare Other | Attending: Emergency Medicine | Admitting: Emergency Medicine

## 2015-08-26 ENCOUNTER — Emergency Department (HOSPITAL_COMMUNITY): Payer: Medicare Other

## 2015-08-26 ENCOUNTER — Encounter (HOSPITAL_COMMUNITY): Payer: Self-pay | Admitting: Emergency Medicine

## 2015-08-26 DIAGNOSIS — I10 Essential (primary) hypertension: Secondary | ICD-10-CM | POA: Diagnosis not present

## 2015-08-26 DIAGNOSIS — R519 Headache, unspecified: Secondary | ICD-10-CM

## 2015-08-26 DIAGNOSIS — R51 Headache: Secondary | ICD-10-CM | POA: Insufficient documentation

## 2015-08-26 DIAGNOSIS — H5711 Ocular pain, right eye: Secondary | ICD-10-CM | POA: Diagnosis not present

## 2015-08-26 DIAGNOSIS — Z79899 Other long term (current) drug therapy: Secondary | ICD-10-CM | POA: Diagnosis not present

## 2015-08-26 DIAGNOSIS — R791 Abnormal coagulation profile: Secondary | ICD-10-CM | POA: Insufficient documentation

## 2015-08-26 LAB — CBC WITH DIFFERENTIAL/PLATELET
BASOS ABS: 0 10*3/uL (ref 0.0–0.1)
Basophils Relative: 1 %
EOS ABS: 0.2 10*3/uL (ref 0.0–0.7)
EOS PCT: 3 %
HCT: 39 % (ref 36.0–46.0)
Hemoglobin: 13.4 g/dL (ref 12.0–15.0)
Lymphocytes Relative: 33 %
Lymphs Abs: 2.4 10*3/uL (ref 0.7–4.0)
MCH: 30.6 pg (ref 26.0–34.0)
MCHC: 34.4 g/dL (ref 30.0–36.0)
MCV: 89 fL (ref 78.0–100.0)
MONO ABS: 0.6 10*3/uL (ref 0.1–1.0)
Monocytes Relative: 8 %
Neutro Abs: 4.1 10*3/uL (ref 1.7–7.7)
Neutrophils Relative %: 55 %
PLATELETS: 233 10*3/uL (ref 150–400)
RBC: 4.38 MIL/uL (ref 3.87–5.11)
RDW: 12.7 % (ref 11.5–15.5)
WBC: 7.3 10*3/uL (ref 4.0–10.5)

## 2015-08-26 LAB — BASIC METABOLIC PANEL
Anion gap: 10 (ref 5–15)
BUN: 11 mg/dL (ref 6–20)
CALCIUM: 9.1 mg/dL (ref 8.9–10.3)
CO2: 24 mmol/L (ref 22–32)
CREATININE: 0.42 mg/dL — AB (ref 0.44–1.00)
Chloride: 106 mmol/L (ref 101–111)
GFR calc Af Amer: >60 mL/min (ref >60)
GLUCOSE: 103 mg/dL — AB (ref 65–99)
Potassium: 3.3 mmol/L — ABNORMAL LOW (ref 3.5–5.1)
SODIUM: 140 mmol/L (ref 135–145)

## 2015-08-26 LAB — PROTIME-INR
INR: 1.08 (ref 0.00–1.49)
PROTHROMBIN TIME: 14.2 s (ref 11.6–15.2)

## 2015-08-26 MED ORDER — TRAMADOL HCL 50 MG PO TABS: 50.0000 mg | ORAL_TABLET | Freq: Four times a day (QID) | ORAL | Status: DC | PRN

## 2015-08-26 MED ORDER — TRAMADOL HCL 50 MG PO TABS
50.0000 mg | ORAL_TABLET | Freq: Once | ORAL | Status: AC
  Administered 2015-08-26: 50 mg via ORAL
  Filled 2015-08-26: qty 1

## 2015-08-26 NOTE — ED Notes (Signed)
Pt sts busted capillaries in right eye x 2 days and HA upon waking this am

## 2015-08-26 NOTE — Discharge Instructions (Signed)
Tramadol as prescribed as needed for pain.  Return to the emergency department if your symptoms significantly worsen or change.   Dolor de cabeza general sin causa (General Headache Without Cause) El dolor de cabeza es un dolor o malestar que se siente en la zona de la cabeza o del cuello. Puede no tener una causa especfica. Hay muchas causas y tipos de dolores de [place]. Los dolores de cabeza ms comunes son los siguientes:  Cefalea tensional.  Cefaleas migraosas.  Cefalea en brotes.  Cefaleas diarias crnicas. INSTRUCCIONES PARA EL CUIDADO EN EL HOGAR  Controle su afeccin para ver si hay cambios. Siga estos pasos para Aeronautical engineer afeccin: Control del [employer] medicamentos de venta libre y los recetados solamente como se lo haya indicado el mdico.  Cuando sienta dolor de cabeza acustese en un cuarto oscuro y tranquilo.  Si se lo indican, aplique hielo sobre la cabeza y la zona del cuello:  Ponga el hielo en una bolsa plstica.  Coloque una toalla entre la piel y la bolsa de hielo.  Coloque el hielo durante 79minutos, 2 a 3veces por Training and development officer.  Utilice una almohadilla trmica o tome una ducha con agua caliente para aplicar calor en la cabeza y la zona del cuello como se lo haya indicado el [place] luces tenues si le [employer] luces brillantes o sus dolores de cabeza empeoran. Comida y bebida  Mantenga un horario para las comidas.  Limite el consumo de bebidas alcohlicas.  Consuma menos cantidad de cafena o deje de tomarla. Instrucciones generales  Concurra a todas las visitas de control como se lo haya indicado el mdico. Esto es importante.  Lleve un diario de los dolores de cabeza para Neurosurgeon qu factores pueden desencadenarlos. Por ejemplo, escriba los siguientes datos:  Lo que usted come y Buyer, retail.  Cunto tiempo duerme.  Algn cambio en su dieta o en los medicamentos.  Pruebe algunas tcnicas de relajacin, como los  [place].  Limite el estrs.  Sintese con la espalda recta y no tense los msculos.  No consuma productos que contengan tabaco, incluidos cigarrillos, tabaco de Higher education careers adviser o cigarrillos electrnicos. Si necesita ayuda para dejar de fumar, consulte al mdico.  Haga actividad fsica habitualmente como se lo haya indicado el mdico.  Tenga un horario fijo para dormir. Duerma entre 7 y 9horas o la cantidad de horas que le haya recomendado el mdico. SOLICITE ATENCIN MDICA SI:   Los medicamentos no Dealer los sntomas.  Tiene un dolor de cabeza que es diferente del dolor de cabeza habitual.  Tiene nuseas o vmitos.  Tiene fiebre. SOLICITE ATENCIN MDICA DE INMEDIATO SI:   El dolor se hace cada vez ms intenso.  Ha vomitado repetidas veces.  Presenta rigidez en el cuello.  Sufre prdida de la visin.  Tiene problemas para hablar.  Siente dolor en el ojo o en el odo.  Presenta debilidad muscular o prdida del control muscular.  Pierde el equilibrio o tiene problemas para Writer.  Sufre mareos o se desmaya.  Se siente confundido.   Esta informacin no tiene Marine scientist el consejo del mdico. Asegrese de hacerle al mdico cualquier pregunta que tenga.   Document Released: 11/04/2004 Document Revised: 10/16/2014 Elsevier Interactive Patient Education [employer].

## 2015-08-26 NOTE — ED Notes (Signed)
Pt and family verbalized understanding of d/c instructions via interpretation. Pt stable, pain free and NAD upon d/c. Pt removed all belongings from room.

## 2015-08-26 NOTE — ED Notes (Signed)
Patient transported to CT 

## 2015-08-26 NOTE — ED Provider Notes (Signed)
CSN: [id number]     Arrival date & time 08/26/15  1349 History   First MD Initiated Contact with Patient 08/26/15 1553     Chief Complaint  Patient presents with  . Eye Pain  . Headache     (Consider location/radiation/quality/duration/timing/severity/associated sxs/prior Treatment) HPI Comments: Patient is a 65 year old Hispanic female with history of HTN. She presents for evaluation of headache and right eye pain. This began yesterday in the absence of any injury or trauma. She denies any visual disturbances, fevers, or chills. She noticed that her eye was red yesterday evening and became much more red today.  Patient is a 65 y.o. female presenting with eye pain. The history is provided by the patient.  Eye Pain This is a new problem. The current episode started yesterday. The problem occurs constantly. The problem has been gradually worsening. Nothing aggravates the symptoms. Nothing relieves the symptoms. She has tried nothing for the symptoms.    Past Medical History  Diagnosis Date  . Osteoarthritis   . Hypertension   . Carpal tunnel syndrome 02/28/2015  . Spinal stenosis of lumbar region 02/28/2015  . Depression 02/28/2015   History reviewed. No pertinent past surgical history. History reviewed. No pertinent family history. Social History  Substance Use Topics  . Smoking status: Never Smoker   . Smokeless tobacco: None  . Alcohol Use: No   OB History    No data available     Review of Systems  All other systems reviewed and are negative.     Allergies  Review of patient's allergies indicates no known allergies.  Home Medications   Prior to Admission medications   Medication Sig Start Date End Date Taking? Authorizing Provider  lisinopril-hydrochlorothiazide (PRINZIDE,ZESTORETIC) 20-25 MG tablet Take 1 tablet by mouth daily. 02/28/15   Arnoldo Morale, MD  meloxicam (MOBIC) 7.5 MG tablet Take 1 tablet (7.5 mg total) by mouth daily. 02/28/15   Arnoldo Morale, MD   oxyCODONE-acetaminophen (PERCOCET) 5-325 MG tablet Take 1 tablet by mouth every 4 (four) hours as needed for moderate pain. [id number]   Delora Fuel, MD  traMADol (ULTRAM) 50 MG tablet Take 1 tablet (50 mg total) by mouth every 8 (eight) hours as needed. 02/28/15   Arnoldo Morale, MD   BP 127/61 mmHg  Pulse 67  Temp(Src) 97.7 F (36.5 C) (Oral)  Resp 16  SpO2 95%  LMP  (LMP Unknown) Physical Exam  Constitutional: She is oriented to person, place, and time. She appears well-developed and well-nourished. No distress.  HENT:  Head: Normocephalic and atraumatic.  Eyes: EOM are normal. Pupils are equal, round, and reactive to light.  There is a large subconjunctival hemorrhage of the lateral aspect of the right eye.  Neck: Normal range of motion. Neck supple.  Cardiovascular: Normal rate and regular rhythm.  Exam reveals no gallop and no friction rub.   No murmur heard. Pulmonary/Chest: Effort normal and breath sounds normal. No respiratory distress. She has no wheezes.  Abdominal: Soft. Bowel sounds are normal. She exhibits no distension. There is no tenderness.  Musculoskeletal: Normal range of motion.  Neurological: She is alert and oriented to person, place, and time. No cranial nerve deficit. She exhibits normal muscle tone. Coordination normal.  Skin: Skin is warm and dry. She is not diaphoretic.  Nursing note and vitals reviewed.   ED Course  Procedures (including critical care time) Labs Review Labs Reviewed  BASIC METABOLIC PANEL  CBC WITH DIFFERENTIAL/PLATELET  PROTIME-INR    Imaging Review  Dg Hand Complete Left  08/24/2015  CLINICAL DATA:  65 year old female with acute left hand pain following fall. Initial encounter. EXAM: LEFT HAND - COMPLETE 3+ VIEW COMPARISON:  None. FINDINGS: No acute fracture, subluxation or dislocation identified. No focal bony lesions are noted. No radiopaque foreign bodies are identified. IMPRESSION: No acute bony abnormality. Electronically Signed    By: Margarette Canada M.D.   On: 08/24/2015 17:38   I have personally reviewed and evaluated these images and lab results as part of my medical decision-making.    MDM   Final diagnoses:  None    Patient presents with complaints of headache and aches of conjunctival hemorrhage that started spontaneously in the absence of any injury or trauma. Her head CT is negative and laboratory studies reveal no thrombocytopenia or other abnormality that would cause spontaneous bleeding. She will be discharged to home with a prescription for tramadol and when necessary return.    Veryl Speak, MD 08/26/15 [phone]

## 2015-09-11 ENCOUNTER — Other Ambulatory Visit: Payer: Self-pay | Admitting: Family Medicine

## 2015-09-11 DIAGNOSIS — I10 Essential (primary) hypertension: Secondary | ICD-10-CM

## 2015-10-29 ENCOUNTER — Ambulatory Visit: Payer: Medicare Other | Attending: Family Medicine | Admitting: Family Medicine

## 2015-10-29 ENCOUNTER — Encounter: Payer: Self-pay | Admitting: Family Medicine

## 2015-10-29 VITALS — BP 132/73 | HR 70 | Temp 97.9°F | Wt 185.0 lb

## 2015-10-29 DIAGNOSIS — I8393 Asymptomatic varicose veins of bilateral lower extremities: Secondary | ICD-10-CM

## 2015-10-29 DIAGNOSIS — M1990 Unspecified osteoarthritis, unspecified site: Secondary | ICD-10-CM | POA: Insufficient documentation

## 2015-10-29 DIAGNOSIS — Z23 Encounter for immunization: Secondary | ICD-10-CM | POA: Diagnosis not present

## 2015-10-29 DIAGNOSIS — M19242 Secondary osteoarthritis, left hand: Secondary | ICD-10-CM | POA: Diagnosis not present

## 2015-10-29 DIAGNOSIS — I10 Essential (primary) hypertension: Secondary | ICD-10-CM | POA: Diagnosis not present

## 2015-10-29 DIAGNOSIS — M7662 Achilles tendinitis, left leg: Secondary | ICD-10-CM

## 2015-10-29 DIAGNOSIS — I8390 Asymptomatic varicose veins of unspecified lower extremity: Secondary | ICD-10-CM

## 2015-10-29 MED ORDER — MELOXICAM 7.5 MG PO TABS: 7.5000 mg | ORAL_TABLET | Freq: Every day | ORAL | 0 refills | Status: DC

## 2015-10-29 MED ORDER — LISINOPRIL-HYDROCHLOROTHIAZIDE 20-25 MG PO TABS: 1.0000 | ORAL_TABLET | Freq: Every day | ORAL | 0 refills | Status: DC

## 2015-10-29 MED ORDER — LISINOPRIL-HYDROCHLOROTHIAZIDE 20-25 MG PO TABS: 1.0000 | ORAL_TABLET | Freq: Every day | ORAL | 3 refills | Status: DC

## 2015-10-29 MED FILL — MELOXICAM 7.5 MG TABLET: 7.5 | 30 days supply | Qty: 30 | Fill #0

## 2015-10-29 MED FILL — LISINOPRIL-HCTZ 20-25 MG TA: 20-25 | 30 days supply | Qty: 30 | Fill #0

## 2015-10-29 NOTE — Progress Notes (Signed)
Pt states she is here for the following:  Medication Refill - Hypertension Referral - Vein Specialist for varicose veins in legs Wrist Pain - left still swellling

## 2015-10-29 NOTE — Patient Instructions (Signed)
Vacuna antigripal (inactivada o recombinante):  (Influenza [Flu] Vaccine [Inactivated or Recombinant]: )  1. Por qu vacunarse? La influenza ("gripe") es una enfermedad contagiosa que se propaga en todo el territorio de los [place] cada [place], por lo general entre octubre y [place]. La gripe es causada por los virus de la influenza y se propaga principalmente por la tos, el estornudo y Network engineer. Cualquier persona puede tener gripe. Esta enfermedad comienza repentinamente y [place]. Los sntomas varan segn la edad, [place] pueden incluir:  Cristy Hilts o escalofros.  Dolor de Investment banker, operational.  Dolores musculares.  Fatiga.  Tos.  Dolor de [place].  Secrecin o congestin nasal. La gripe, adems, puede derivar en neumona e infecciones de la sangre, y causar diarrea y convulsiones en los nios. Si tiene una afeccin, por ejemplo, enfermedad cardaca o pulmonar, la gripe puede empeorarla. En algunas personas, la gripe es ms peligrosa. Los bebs y los nios pequeos, los Nordstrom de 66aos, las [place], as [employer] personas que tienen ciertas enfermedades o cuyo sistema inmunitario est debilitado corren Retail buyer. Cada ao, miles de [employer] [place] debido a la gripe, y muchas ms deben ser hospitalizadas. La vacuna antigripal puede:  Protegerlo contra la gripe.  Hacer que la gripe sea menos grave en caso de que la contraiga.  Evitar que se la transmita a su familia y a Producer, television/film/video. 2. Vacunas antigripales inactivadas y recombinantes Cada temporada de gripe, se recomienda la aplicacin de una dosis de vacuna antigripal. Es posible que los nios menores de 10meses a 8aos deban recibir dos dosis durante la misma temporada de gripe. Todas las International Paper tienen que aplicarse una sola dosis cada temporada de gripe. Algunas de las vacunas antigripales inactivadas contienen una cantidad muy pequea de un conservante a base de mercurio  llamado timerosal. Algunos estudios han demostrado que el timerosal en las vacunas no es perjudicial, pero se dispone de vacunas antigripales que no contienen el conservante. Las vacunas antigripales no se elaboran con el virus vivo de la gripe. No pueden causar gripe. Hay muchos virus de la gripe, y Information systems manager. Cada ao, se elabora una nueva vacuna antigripal para brindar proteccin contra tres o cuatro virus que probablemente causen la enfermedad en la siguiente temporada de gripe. Pero incluso si la vacuna no es especfica para estos virus, aun as puede brindar cierta proteccin. La vacuna antigripal no puede evitar:  La gripe causada por un virus que la vacuna no puede prevenir.  Las [employer] se parecen a la gripe, pero que no lo son. La vacuna comienza a surtir efecto aproximadamente 2semanas despus de su aplicacin y brinda proteccin durante la temporada de gripe. 3. Algunas personas no deben recibir esta vacuna Informe a la persona que le aplica la vacuna:  Si tiene [place]rich graves, potencialmente mortales. Si alguna vez tuvo una reaccin alrgica potencialmente mortal despus de recibir una dosis de la vacuna antigripal, o tuvo una alergia grave a cualquiera de los componentes de esta vacuna, es posible que se le recomiende no vacunarse. La State [employer] de los tipos de vacuna antigripal, si bien no todos, contienen [place] cantidad de protena de [place].  Si alguna vez tuvo sndrome de Guillain-Barre (tambin llamado GBS). Algunas personas con antecedentes de GBS no deben recibir esta vacuna. Debe hablar al respecto con el mdico.  Si no se siente bien. Por lo general, puede recibir la vacuna antigripal si tiene una enfermedad leve; sin embargo, podran  pedirle que regrese cuando se sienta mejor. 4. [place] de [place] reaccin a la vacuna Con cualquier medicamento, incluidas las vacunas, existe la posibilidad de que [employer]. Estas suelen ser leves y  desaparecer por s solas, pero tambin es posible que se presenten reacciones graves. [place] personas a las que se les aplica la vacuna antigripal no tienen ningn problema. Los problemas menores despus de la aplicacin de la vacuna antigripal incluyen:  Social research officer, government, enrojecimiento o Estate agent en el que le aplicaron la vacuna.  [place].  Dolor, enrojecimiento o [employer] ojos.  Tos.  Cristy Hilts.  Dolores.  Dolor de [place].  [place].  Fatiga. Si estos problemas ocurren, en general comienzan poco despus de la aplicacin de la vacuna y duran 1 o 2das. Los problemas ms graves despus de la aplicacin de la vacuna antigripal pueden incluir lo siguiente:  Puede haber un pequeo aumento del riesgo de sufrir sndrome de Guillain-Barre (GBS) despus de la aplicacin de la vacuna antigripal inactivada. El clculo estimativo de este riesgo es de 1 o 2casos por cada milln de personas vacunadas, mucho ms bajo que el riesgo de sufrir complicaciones graves debido a la gripe, que pueden evitarse con la vacuna antigripal.  Los nios pequeos que reciben la vacuna antigripal junto con la vacuna antineumoccica (PCV13) o la DTaP en el mismo momento pueden tener una probabilidad un poco ms elevada de tener una convulsin debido a la fiebre. Consulte al mdico para obtener ms informacin. Informe al mdico si un nio que est recibiendo la vacuna antigripal ha tenido una convulsin alguna vez. Problemas que podran ocurrir despus de cualquier vacuna inyectable:  Las personas a veces se desmayan despus de un procedimiento mdico, incluida la vacunacin. Permanecer sentado o recostado durante 14minutos puede ayudar a Merrill Lynch y las lesiones causadas por las cadas. Informe al mdico si se siente mareado, tiene cambios en la visin o zumbidos en los odos.  Algunas personas sienten un dolor intenso en el hombro y tienen dificultad para mover el brazo donde se coloc  la vacuna. Esto sucede con muy poca frecuencia.  Cualquier medicamento puede causar una reaccin alrgica grave. Dichas reacciones son Orlene Erm poco frecuentes con una vacuna (se calcula que menos de 1en un milln de dosis) y se producen unos minutos a unas horas despus de la vacunacin. Al igual que con cualquier [employer] Company, existe una probabilidad muy remota de que una vacuna cause una lesin grave o la [place]. Se controla permanentemente la seguridad de las vacunas. Para obtener ms informacin, visite: http://www.aguilar.org/. 5. Qu pasa si hay una reaccin grave? A qu signos debo estar atento?  Observe todo lo que le preocupe, como signos de una reaccin alrgica grave, fiebre muy alta o comportamiento fuera de lo normal. Los signos de una reaccin alrgica grave pueden incluir ronchas, hinchazn de la cara y la garganta, dificultad para respirar, latidos cardacos acelerados, mareos y debilidad. Pueden comenzar entre unos pocos minutos y algunas horas despus de la vacunacin. Qu debo hacer?  Si cree que se trata de una reaccin alrgica grave o de otra emergencia que no puede esperar, llame [phone] o lleve a la persona al hospital ms cercano. De lo contrario, llame al [employer].  Las reacciones deben informarse al Sistema de Informacin sobre Efectos Adversos de las Clinical biochemist (Vaccine Adverse Event Reporting System, VAERS). El mdico debe presentar este informe, o bien puede hacerlo usted mismo a travs del sitio web de VAERS, en www.vaers.SamedayNews.es, o llamando  al [phone]. VAERSno ofrece consejos mdicos. 6. [place] Compensacin de Daos por [place] de Compensacin de Daos por Clinical biochemist (National Vaccine Injury Compensation Program, VICP) es un programa federal que fue creado para Patent examiner a las personas que puedan haber sufrido daos al recibir ciertas vacunas. Aquellas personas que consideren que han sufrido un dao como consecuencia de una vacuna y  [place] saber ms acerca del programa y de cmo presentar un reclamo, pueden llamar al [phone] o visitar el sitio web del VICP en GoldCloset.com.ee. Hay un lmite de tiempo para presentar un reclamo de compensacin. 7. Cmo puedo obtener ms informacin?  Pregntele al mdico. Este puede darle el prospecto de la vacuna o recomendarle otras fuentes de informacin.  Comunquese con el servicio de salud de su localidad o su estado.  Comunquese con los Centros para Building surveyor y la Prevencin de Probation officer for Disease Control and Prevention, CDC):  Llame al [phone] (1-800-CDC-INFO) o  visite el sitio web [employer] en https://gibson.com/. Declaracin de informacin sobre la vacuna Vacuna antigripal inactivada (09/14/2013)   Esta informacin no tiene Marine scientist el consejo del mdico. Asegrese de hacerle al mdico cualquier pregunta que tenga.   Document Released: 04/23/2008 Document Revised: 02/15/2014 Elsevier Interactive Patient Education [employer].

## 2015-10-29 NOTE — [facility] Notes (Signed)
Subjective:  Patient ID: Shelly Meyer, female    DOB: [birth year]  Age: 65 y.o. MRN: [id number]  CC: Follow-up (hypertension); Leg Swelling (bilateral x over a year ago); Referral (Vein Specialist); and Wrist Pain (left x 2 mths still swollen)   HPI Cullen Gilchrist is a 65 year old female with a history of hypertension who presents today complaining of varicose veins which she would like to have surgically removed. Also complains of left middle finger pain and swelling ever since she took a fall 2 months ago; she was seen at the ED and left hand x-ray was negative for fractures. She was placed in a forearm brace which she has used since then with no relief in symptoms. Initially took tramadol which she later discontinued due to concern about side effects.  Also has left Achilles tendon tenderness and occasional swelling.  Past Medical History:  Diagnosis Date  . Carpal tunnel syndrome 02/28/2015  . Depression 02/28/2015  . Hypertension   . Osteoarthritis   . Spinal stenosis of lumbar region 02/28/2015    No past surgical history on file.  No Known Allergies   Outpatient Medications Prior to Visit  Medication Sig Dispense Refill  . traMADol (ULTRAM) 50 MG tablet Take 1 tablet (50 mg total) by mouth every 6 (six) hours as needed. 15 tablet 0  . lisinopril-hydrochlorothiazide (PRINZIDE,ZESTORETIC) 20-25 MG tablet TAKE 1 TABLET BY MOUTH DAILY 30 tablet 0  . oxyCODONE-acetaminophen (PERCOCET) 5-325 MG tablet Take 1 tablet by mouth every 4 (four) hours as needed for moderate pain. (Patient not taking: Reported on 10/29/2015) 15 tablet 0  . meloxicam (MOBIC) 7.5 MG tablet Take 1 tablet (7.5 mg total) by mouth daily. (Patient not taking: Reported on 10/29/2015) 30 tablet 0   No facility-administered medications prior to visit.     ROS Review of Systems Constitutional: Negative for activity change, appetite change and fatigue.  HENT: Negative for congestion, sinus pressure and sore throat.     Eyes: Negative for visual disturbance.  Respiratory: Negative for cough, chest tightness, shortness of breath and wheezing.   Cardiovascular: Negative for chest pain and palpitations.  Gastrointestinal: Negative for abdominal pain, constipation and abdominal distention.  Endocrine: Negative for polydipsia.  Genitourinary: Negative for dysuria and frequency.  Musculoskeletal:       See history of present illness  Skin: Negative for rash.  Neurological: Positive for headaches. Negative for tremors, light-headedness and numbness.  Hematological: Does not bruise/bleed easily.  Psychiatric/Behavioral: Negative for behavioral problems and agitation.   Objective:  BP 132/73 (BP Location: Right Arm, Patient Position: Sitting, Cuff Size: Large)   Pulse 70   Temp 97.9 F (36.6 C) (Oral)   Wt 185 lb (83.9 kg)   LMP  (LMP Unknown)   BMI 34.96 kg/m   BP/Weight 10/29/2015 08/26/2015 [id number]  Systolic BP Q000111Q Q000111Q [id number]  Diastolic BP 73 68 70  Wt. (Lbs) 185 - -  BMI 34.96 - -      Physical Exam Constitutional: She is oriented to person, place, and time. She appears well-developed and well-nourished.  Cardiovascular: Normal rate, normal heart sounds and intact distal pulses.   No murmur heard. Pulmonary/Chest: Effort normal and breath sounds normal. She has no wheezes. She has no rales. She exhibits no tenderness.  Abdominal: Soft. Bowel sounds are normal. She exhibits no distension and no mass. There is no tenderness.  Musculoskeletal: Normal range of motion. She exhibits tenderness (tenderness on palpation of left Achilles tendon).  Tenderness on palpation of middle MCP  joint with associated edema. Neurological: She is alert and oriented to person, place, and time.  Skin:  Varicose veins of the right leg   CLINICAL DATA:  65 year old female with acute left hand pain following fall. Initial encounter.  EXAM: LEFT HAND - COMPLETE 3+ VIEW  COMPARISON:  None.  FINDINGS: No acute  fracture, subluxation or dislocation identified.  No focal bony lesions are noted.  No radiopaque foreign bodies are identified.  IMPRESSION: No acute bony abnormality.   Electronically Signed   By: Margarette Canada M.D.   On: 08/24/2015 17:38 Assessment & Plan:   1. Varicose vein of leg - Ambulatory referral to Vascular Surgery  2. Essential hypertension Controlled - lisinopril-hydrochlorothiazide (PRINZIDE,ZESTORETIC) 20-25 MG tablet; Take 1 tablet by mouth daily.  Dispense: 30 tablet; Refill: 0  3. Secondary osteoarthritis of left hand Could be secondary to underlying osteoarthritis with superimposed fall Use mobic for pain; PPI is abdominal pain occurs - DG Hand Complete Left; Future  4. Achilles tendinitis of left lower extremity - meloxicam (MOBIC) 7.5 MG tablet; Take 1 tablet (7.5 mg total) by mouth daily.  Dispense: 30 tablet; Refill: 0   Meds ordered this encounter  Medications  . meloxicam (MOBIC) 7.5 MG tablet    Sig: Take 1 tablet (7.5 mg total) by mouth daily.    Dispense:  30 tablet    Refill:  0  . lisinopril-hydrochlorothiazide (PRINZIDE,ZESTORETIC) 20-25 MG tablet    Sig: Take 1 tablet by mouth daily.    Dispense:  30 tablet    Refill:  0    Must have office visit for refills    Follow-up: Return in about 1 month (around 11/28/2015) for follow up of hand pain.   Arnoldo Morale MD

## 2015-12-05 ENCOUNTER — Telehealth: Payer: Self-pay | Admitting: Family Medicine

## 2015-12-05 DIAGNOSIS — I10 Essential (primary) hypertension: Secondary | ICD-10-CM

## 2015-12-05 MED ORDER — LISINOPRIL-HYDROCHLOROTHIAZIDE 20-25 MG PO TABS: 1.0000 | ORAL_TABLET | Freq: Every day | ORAL | 3 refills | Status: DC

## 2015-12-05 NOTE — Telephone Encounter (Signed)
Lisinopril-HCTZ refilled.

## 2015-12-05 NOTE — Telephone Encounter (Signed)
Patient would like lisinopril sent to [facility] on [address]. Please follow up.

## 2016-01-20 ENCOUNTER — Encounter: Payer: Self-pay | Admitting: Vascular Surgery

## 2016-01-27 ENCOUNTER — Encounter: Payer: Self-pay | Admitting: Vascular Surgery

## 2016-01-27 ENCOUNTER — Ambulatory Visit (INDEPENDENT_AMBULATORY_CARE_PROVIDER_SITE_OTHER): Payer: Medicare Other | Admitting: Vascular Surgery

## 2016-01-27 VITALS — BP 140/80 | HR 96 | Temp 97.4°F | Resp 16 | Ht 63.0 in | Wt 186.0 lb

## 2016-01-27 DIAGNOSIS — I83893 Varicose veins of bilateral lower extremities with other complications: Secondary | ICD-10-CM

## 2016-01-27 DIAGNOSIS — I868 Varicose veins of other specified sites: Secondary | ICD-10-CM

## 2016-01-27 NOTE — Addendum Note (Signed)
Addended by: Lianne Cure A on: 01/27/2016 04:33 PM   Modules accepted: Orders

## 2016-01-27 NOTE — [facility] Notes (Signed)
Subjective:     Patient ID: Shelly Meyer, female   DOB: [birth year], 65 y.o.   MRN: [id number]  HPI This 65 year old Hispanic female is accompanied by an interpreter. She does not speak [place]. She moved here from [place] 1 year ago. She has a long history of bulging varicose veins and swelling in both legs right worse than left. She has no history of DVT thrombophlebitis stasis ulcers or bleeding. She has worn short leg elastic compression stockings on occasion with no improvement in her symptoms. She is having aching throbbing and burning discomfort right worse than left particularly in the calf and ankle area. This is partially relieved by elevation.  Past Medical History:  Diagnosis Date  . Carpal tunnel syndrome 02/28/2015  . Depression 02/28/2015  . Hypertension   . Osteoarthritis   . Spinal stenosis of lumbar region 02/28/2015  . Varicose veins of both lower extremities     Social History  Substance Use Topics  . Smoking status: Never Smoker  . Smokeless tobacco: Never Used  . Alcohol use No    Family History  Problem Relation Age of Onset  . Stroke Mother     No Known Allergies   Current Outpatient Prescriptions:  .  lisinopril-hydrochlorothiazide (PRINZIDE,ZESTORETIC) 20-25 MG tablet, Take 1 tablet by mouth daily., Disp: 30 tablet, Rfl: 3 .  meloxicam (MOBIC) 7.5 MG tablet, Take 1 tablet (7.5 mg total) by mouth daily., Disp: 30 tablet, Rfl: 0 .  oxyCODONE-acetaminophen (PERCOCET) 5-325 MG tablet, Take 1 tablet by mouth every 4 (four) hours as needed for moderate pain. (Patient not taking: Reported on 01/27/2016), Disp: 15 tablet, Rfl: 0 .  traMADol (ULTRAM) 50 MG tablet, Take 1 tablet (50 mg total) by mouth every 6 (six) hours as needed. (Patient not taking: Reported on 01/27/2016), Disp: 15 tablet, Rfl: 0  Vitals:   01/27/16 1515  BP: 140/80  Pulse: 96  Resp: 16  Temp: 97.4 F (36.3 C)  SpO2: 97%  Weight: 186 lb (84.4 kg)  Height: 5\' 3"  (1.6 m)    Body mass index  is 32.95 kg/m.         Review of Systems Patient has history of osteoarthritis, spinal stenosis and lumbar canal, depression, hypertension. Denies chest pain, dyspnea on exertion, PND, orthopnea.    Objective:   Physical Exam BP 140/80 (BP Location: Left Arm, Patient Position: Sitting, Cuff Size: Large)   Pulse 96   Temp 97.4 F (36.3 C)   Resp 16   Ht 5\' 3"  (1.6 m)   Wt 186 lb (84.4 kg)   LMP  (LMP Unknown)   SpO2 97%   BMI 32.95 kg/m     Gen.-alert and oriented x3 in no apparent distress HEENT normal for age Lungs no rhonchi or wheezing Cardiovascular regular rhythm no murmurs carotid pulses 3+ palpable no bruits audible Abdomen soft nontender no palpable masses Musculoskeletal free of  major deformities Skin clear -no rashes Neurologic normal Lower extremities 3+ femoral and dorsalis pedis pulses palpable bilaterally with 1-2+ edema on the right minimal edema on the left Large bulging varicosities right leg beginning in the distal thigh extending into the medial calf into the medial malleolar area with hyperpigmentation and reticular veins Left leg with bulging varicosities and medial calf below the knee but no hyperpigmentation or ulceration  Today I performed a bedside sono site-ultrasound exam. Patient has a very large right great saphenous vein with gross reflux throughout supplying these painful varicosities Left great saphenous vein appears enlarged with  some reflux which will be clarified on formal examination at next visit       Assessment:     Painful varicosities and swelling bilaterally right worse than left due to gross reflux right great saphenous vein and possible left great saphenous vein. Causing symptoms affecting patient's daily living.    Plan:         #1 long leg elastic compression stockings 20-30 mm gradient #2 elevate legs as much as possible #3 ibuprofen daily on a regular basis for pain #4 return in 3 months-we will make formal  recommendation at next visit following formal venous reflux study Patient will at least need laser ablation right great saphenous vein followed by three-month waiting period and stab phlebectomy evaluation with left leg to be determined

## 2016-03-05 ENCOUNTER — Other Ambulatory Visit: Payer: Self-pay | Admitting: Family Medicine

## 2016-03-05 DIAGNOSIS — I10 Essential (primary) hypertension: Secondary | ICD-10-CM

## 2016-04-16 ENCOUNTER — Encounter: Payer: Self-pay | Admitting: Family

## 2016-04-26 ENCOUNTER — Ambulatory Visit (INDEPENDENT_AMBULATORY_CARE_PROVIDER_SITE_OTHER): Payer: Medicare Other | Admitting: Vascular Surgery

## 2016-04-26 ENCOUNTER — Ambulatory Visit (HOSPITAL_COMMUNITY)
Admission: RE | Admit: 2016-04-26 | Discharge: 2016-04-26 | Disposition: A | Discharge status: Home / Self Care | Payer: Medicare Other | Source: Ambulatory Visit | Attending: Vascular Surgery | Admitting: Vascular Surgery

## 2016-04-26 ENCOUNTER — Encounter: Payer: Self-pay | Admitting: Vascular Surgery

## 2016-04-26 VITALS — BP 143/79 | HR 75 | Temp 97.4°F | Resp 18 | Ht 62.0 in | Wt 191.0 lb

## 2016-04-26 DIAGNOSIS — I83893 Varicose veins of bilateral lower extremities with other complications: Secondary | ICD-10-CM

## 2016-04-26 NOTE — Progress Notes (Signed)
Subjective:     Patient ID: Shelly Meyer, female   DOB: Apr 07, 1950, 66 y.o.   MRN: [id number]  HPI This 66 year old female returns for 3 month follow-up regarding her painful varicose veins in both lower extremities right worse than left. She is tried long-leg elastic compression stockings 20-30 millimeter gradient as well as elevation and ibuprofen with no improvement in her symptoms. She develops aching throbbing and burning discomfort which worsens as the day progresses. She also developed swelling right worse than left. The symptoms are affecting her daily living.  Past Medical History:  Diagnosis Date  . Carpal tunnel syndrome 02/28/2015  . Depression 02/28/2015  . Hypertension   . Osteoarthritis   . Spinal stenosis of lumbar region 02/28/2015  . Varicose veins of both lower extremities     Social History  Substance Use Topics  . Smoking status: Never Smoker  . Smokeless tobacco: Never Used  . Alcohol use No    Family History  Problem Relation Age of Onset  . Stroke Mother     No Known Allergies   Current Outpatient Prescriptions:  .  Ibuprofen 200 MG CAPS, Take by mouth every 8 (eight) hours., Disp: , Rfl:  .  lisinopril-hydrochlorothiazide (PRINZIDE,ZESTORETIC) 20-25 MG tablet, TAKE 1 TABLET BY MOUTH DAILY, Disp: 30 tablet, Rfl: 0 .  meloxicam (MOBIC) 7.5 MG tablet, Take 1 tablet (7.5 mg total) by mouth daily. (Patient not taking: Reported on 04/26/2016), Disp: 30 tablet, Rfl: 0 .  oxyCODONE-acetaminophen (PERCOCET) 5-325 MG tablet, Take 1 tablet by mouth every 4 (four) hours as needed for moderate pain. (Patient not taking: Reported on 01/27/2016), Disp: 15 tablet, Rfl: 0 .  traMADol (ULTRAM) 50 MG tablet, Take 1 tablet (50 mg total) by mouth every 6 (six) hours as needed. (Patient not taking: Reported on 01/27/2016), Disp: 15 tablet, Rfl: 0  There were no vitals filed for this visit.  There is no height or weight on file to calculate BMI.         Review of  Systems As chest pain, dyspnea on exertion, PND, orthopnea, hemoptysis. Does have history of depression    Objective:   Physical Exam LMP  (LMP Unknown)   Well-developed well-nourished female no apparent distress alert and oriented 3-does not speaking [place]-accompanied by an interpreter Lungs no rhonchi or wheezing Cardiovascular regular rhythm no murmurs Right leg with large bulging varicosities beginning in the distal thigh extending to the medial malleolus with 1+ distal edema and early hyperpigmentation. Left leg with smaller varicosities but present in the medial calf over the great saphenous vein with 1+ distal edema.  Today I ordered bilateral venous reflux study of the lower extremities which I reviewed and interpreted. There is gross reflux in both great saphenous veins supplying these painful varicosities with no DVT     Assessment:     Bilateral painful varicosities right worse than left due to gross reflux bilateral great saphenous veins causing symptoms which are affecting patient's daily living and resistant to conservative measures including long-leg elastic compression stockings 20-30 millimeter gradient, elevation, and ibuprofen    Plan:     Patient needs #1 laser ablation right great saphenous vein followed by #2 laser ablation left great saphenous vein. She will then return in 3 months to be evaluated for possible stab phlebectomy of residual painful varicosities We will proceed in the near future to hopefully relieve her symptoms

## 2016-04-28 ENCOUNTER — Other Ambulatory Visit: Payer: Self-pay | Admitting: *Deleted

## 2016-04-28 DIAGNOSIS — I83893 Varicose veins of bilateral lower extremities with other complications: Secondary | ICD-10-CM

## 2016-04-30 ENCOUNTER — Other Ambulatory Visit: Payer: Self-pay | Admitting: Family Medicine

## 2016-04-30 DIAGNOSIS — I10 Essential (primary) hypertension: Secondary | ICD-10-CM

## 2016-05-04 ENCOUNTER — Encounter: Payer: Self-pay | Admitting: Vascular Surgery

## 2016-05-05 ENCOUNTER — Encounter: Payer: Self-pay | Admitting: Vascular Surgery

## 2016-05-13 ENCOUNTER — Encounter: Payer: Self-pay | Admitting: Vascular Surgery

## 2016-05-14 ENCOUNTER — Encounter: Payer: Self-pay | Admitting: Vascular Surgery

## 2016-05-14 ENCOUNTER — Ambulatory Visit (INDEPENDENT_AMBULATORY_CARE_PROVIDER_SITE_OTHER): Payer: Medicare Other | Admitting: Vascular Surgery

## 2016-05-14 VITALS — BP 130/70 | HR 75 | Temp 97.4°F | Resp 18 | Ht 62.0 in | Wt 191.2 lb

## 2016-05-14 DIAGNOSIS — I83893 Varicose veins of bilateral lower extremities with other complications: Secondary | ICD-10-CM | POA: Diagnosis not present

## 2016-05-14 HISTORY — PX: ENDOVENOUS ABLATION SAPHENOUS VEIN W/ LASER: SUR449

## 2016-05-14 NOTE — Progress Notes (Signed)
Laser Ablation Procedure    Date: 05/14/2016   Sherryann Frese DOB:1950/12/01  Consent signed: Yes    Surgeon:  Dr. Nelda Severe. Kellie Simmering  Procedure: Laser Ablation: right Greater Saphenous Vein  BP 130/70 (BP Location: Right Arm, Patient Position: Sitting, Cuff Size: Large)   Pulse 75   Temp 97.4 F (36.3 C) (Oral)   Resp 18   Ht 5\' 2"  (1.575 m)   Wt 191 lb 3.2 oz (86.7 kg)   LMP  (LMP Unknown)   SpO2 97%   BMI 34.97 kg/m   Tumescent Anesthesia: 400 cc 0.9% NaCl with 50 cc Lidocaine HCL with 1% Epi and 15 cc 8.4% NaHCO3  Local Anesthesia: 3 cc Lidocaine HCL and NaHCO3 (ratio 2:1)  Pulsed Mode: 15 watts, 539ms delay, 1.0 duration  Total Energy:   2,011  Joules         Total Pulses:   135             Total Time: 2:14      Patient tolerated procedure well  Notes: Spanish translator Truitt Merle [facility]) present before procedure to explain consents, during procedure to assist with communication and after procedure to explain post procedure instructions and follow ups.    Description of Procedure:  After marking the course of the secondary varicosities, the patient was placed on the operating table in the supine position, and the right leg was prepped and draped in sterile fashion.   Local anesthetic was administered and under ultrasound guidance the saphenous vein was accessed with a micro needle and guide wire; then the mirco puncture sheath was placed.  A guide wire was inserted saphenofemoral junction , followed by a 5 french sheath.  The position of the sheath and then the laser fiber below the junction was confirmed using the ultrasound.  Tumescent anesthesia was administered along the course of the saphenous vein using ultrasound guidance. The patient was placed in Trendelenburg position and protective laser glasses were placed on patient and staff, and the laser was fired at 15 watts continuous mode advancing 1-38mm/second for a total of 2011 joules.   Steri strip was applied to the IV   insertion site and ABD pads and thigh high compression stockings were applied.  Ace wrap bandages were applied at the top of the saphenofemoral junction. Blood loss was less than 15 cc.  The patient ambulated out of the operating room having tolerated the procedure well.

## 2016-05-14 NOTE — Progress Notes (Signed)
Subjective:     Patient ID: Shelly Meyer, female   DOB: 09-13-1950, 66 y.o.   MRN: [id number]  HPI This 66 year old female had laser ablation of the right great saphenous vein from the proximal calf to near the saphenofemoral junction performed under local tumescent anesthesia. A total of 2011 J of energy was utilized. She tolerated the procedure well.  Review of Systems     Objective:   Physical Exam BP 130/70 (BP Location: Right Arm, Patient Position: Sitting, Cuff Size: Large)   Pulse 75   Temp 97.4 F (36.3 C) (Oral)   Resp 18   Ht 5\' 2"  (1.575 m)   Wt 191 lb 3.2 oz (86.7 kg)   LMP  (LMP Unknown)   SpO2 97%   BMI 34.97 kg/m       Assessment:     Well-tolerated laser ablation right great saphenous vein performed under local tumescent anesthesia    Plan:     Return in 1 week for venous duplex exam to confirm closure right great saphenous vein She will have similar procedure on left leg and near future

## 2016-05-17 ENCOUNTER — Ambulatory Visit (INDEPENDENT_AMBULATORY_CARE_PROVIDER_SITE_OTHER): Payer: Medicare Other | Admitting: Vascular Surgery

## 2016-05-17 ENCOUNTER — Encounter: Payer: Self-pay | Admitting: Vascular Surgery

## 2016-05-17 ENCOUNTER — Ambulatory Visit (HOSPITAL_COMMUNITY)
Admission: RE | Admit: 2016-05-17 | Discharge: 2016-05-17 | Disposition: A | Discharge status: Home / Self Care | Payer: Medicare Other | Source: Ambulatory Visit | Attending: Vascular Surgery | Admitting: Vascular Surgery

## 2016-05-17 VITALS — BP 120/75 | HR 73 | Temp 98.9°F | Resp 18 | Ht 62.0 in | Wt 191.0 lb

## 2016-05-17 DIAGNOSIS — I83893 Varicose veins of bilateral lower extremities with other complications: Secondary | ICD-10-CM

## 2016-05-17 DIAGNOSIS — Z9889 Other specified postprocedural states: Secondary | ICD-10-CM | POA: Diagnosis not present

## 2016-05-17 NOTE — Progress Notes (Signed)
Subjective:     Patient ID: Shelly Meyer, female   DOB: 1950/08/16, 66 y.o.   MRN: [id number]  HPI This 66 year old Hispanic female is accompanied by an interpreter. She returns 1 week post-laser ablation right great saphenous vein for gross reflux with painful varicosities. She has had moderate discomfort along the course of the great saphenous vein which is now improving. She has taken her ibuprofen as instructed and worn her elastic compression stocking. She has had no change in her distal edema which is mild. She continues to have symptoms in the contralateral left leg.  Past Medical History:  Diagnosis Date  . Carpal tunnel syndrome 02/28/2015  . Depression 02/28/2015  . Hypertension   . Osteoarthritis   . Spinal stenosis of lumbar region 02/28/2015  . Varicose veins of both lower extremities     Social History  Substance Use Topics  . Smoking status: Never Smoker  . Smokeless tobacco: Never Used  . Alcohol use No    Family History  Problem Relation Age of Onset  . Stroke Mother     Not on File   Current Outpatient Prescriptions:  .  Ibuprofen 200 MG CAPS, Take by mouth every 8 (eight) hours., Disp: , Rfl:  .  lisinopril-hydrochlorothiazide (PRINZIDE,ZESTORETIC) 20-25 MG tablet, TAKE 1 TABLET EVERY DAY, Disp: 30 tablet, Rfl: 0 .  meloxicam (MOBIC) 7.5 MG tablet, Take 1 tablet (7.5 mg total) by mouth daily. (Patient not taking: Reported on 04/26/2016), Disp: 30 tablet, Rfl: 0 .  oxyCODONE-acetaminophen (PERCOCET) 5-325 MG tablet, Take 1 tablet by mouth every 4 (four) hours as needed for moderate pain. (Patient not taking: Reported on 01/27/2016), Disp: 15 tablet, Rfl: 0 .  traMADol (ULTRAM) 50 MG tablet, Take 1 tablet (50 mg total) by mouth every 6 (six) hours as needed. (Patient not taking: Reported on 01/27/2016), Disp: 15 tablet, Rfl: 0  Vitals:   05/17/16 1335  BP: 120/75  Pulse: 73  Resp: 18  Temp: 98.9 F (37.2 C)  TempSrc: Oral  SpO2: 98%  Weight: 191 lb (86.6 kg)   Height: 5\' 2"  (1.575 m)    Body mass index is 34.93 kg/m.         Review of Systems   denies chest pain, dyspnea on exertion, PND, orthopnea, hemoptysis  Objective:   Physical Exam BP 120/75 (BP Location: Left Arm, Patient Position: Sitting, Cuff Size: Large)   Pulse 73   Temp 98.9 F (37.2 C) (Oral)   Resp 18   Ht 5\' 2"  (1.575 m)   Wt 191 lb (86.6 kg)   LMP  (LMP Unknown)   SpO2 98%   BMI 34.93 kg/m   Gen. well-developed well-nourished female no apparent distress alert and oriented 3 Lungs no rhonchi or wheezing Right leg with mild tenderness to deep palpation over great saphenous vein from the knee to the inguinal crease. Minimal distal edema. Bulging varicosities in the mid calf unchanged but less tense 3+ dorsalis pedis pulse palpable.  Today I ordered a venous duplex exam the right leg which I reviewed and interpreted. There is no DVT. There is total closure of the great saphenous vein up to near the saphenofemoral junction     Assessment:     Successful laser ablation right great saphenous vein for gross reflux with painful varicosities     Plan:     Return next week for similar procedure and contralateral left leg Patient will then return in 3 months for evaluation of possible stab phlebectomy for residual  painful varicosities

## 2016-05-20 ENCOUNTER — Encounter: Payer: Self-pay | Admitting: Vascular Surgery

## 2016-05-25 ENCOUNTER — Other Ambulatory Visit: Payer: Medicare Other | Admitting: Vascular Surgery

## 2016-06-01 ENCOUNTER — Ambulatory Visit (HOSPITAL_COMMUNITY): Payer: Medicare Other

## 2016-06-01 ENCOUNTER — Ambulatory Visit: Payer: Medicare Other | Admitting: Vascular Surgery

## 2016-06-13 ENCOUNTER — Other Ambulatory Visit: Payer: Self-pay | Admitting: Family Medicine

## 2016-06-13 DIAGNOSIS — I10 Essential (primary) hypertension: Secondary | ICD-10-CM

## 2016-07-23 ENCOUNTER — Other Ambulatory Visit: Payer: Self-pay | Admitting: Family Medicine

## 2016-07-23 DIAGNOSIS — I10 Essential (primary) hypertension: Secondary | ICD-10-CM

## 2016-09-19 ENCOUNTER — Other Ambulatory Visit: Payer: Self-pay | Admitting: Family Medicine

## 2016-09-19 DIAGNOSIS — I10 Essential (primary) hypertension: Secondary | ICD-10-CM

## 2016-12-01 ENCOUNTER — Other Ambulatory Visit: Payer: Self-pay | Admitting: Family Medicine

## 2016-12-01 DIAGNOSIS — R5383 Other fatigue: Secondary | ICD-10-CM | POA: Diagnosis not present

## 2016-12-01 DIAGNOSIS — Z1231 Encounter for screening mammogram for malignant neoplasm of breast: Secondary | ICD-10-CM | POA: Diagnosis not present

## 2016-12-01 DIAGNOSIS — Z01419 Encounter for gynecological examination (general) (routine) without abnormal findings: Secondary | ICD-10-CM | POA: Diagnosis not present

## 2016-12-01 DIAGNOSIS — I10 Essential (primary) hypertension: Secondary | ICD-10-CM

## 2016-12-01 DIAGNOSIS — E559 Vitamin D deficiency, unspecified: Secondary | ICD-10-CM | POA: Diagnosis not present

## 2016-12-01 DIAGNOSIS — Z124 Encounter for screening for malignant neoplasm of cervix: Secondary | ICD-10-CM | POA: Diagnosis not present

## 2016-12-02 ENCOUNTER — Telehealth: Payer: Self-pay | Admitting: Family Medicine

## 2016-12-02 NOTE — Telephone Encounter (Signed)
Pt call need a refill for  lisinopril-hydrochlorothiazide (PRINZIDE,ZESTORETIC) 20-25 MG tablet   Please sent it to the  [id number] - [place], [place] #:  [id number]    she has an appt with the provider 01/18/17, please let them know if can be auth or not

## 2016-12-06 ENCOUNTER — Other Ambulatory Visit: Payer: Self-pay | Admitting: Pharmacist

## 2016-12-06 DIAGNOSIS — N840 Polyp of corpus uteri: Secondary | ICD-10-CM | POA: Diagnosis not present

## 2016-12-06 DIAGNOSIS — I10 Essential (primary) hypertension: Secondary | ICD-10-CM

## 2016-12-06 DIAGNOSIS — R102 Pelvic and perineal pain: Secondary | ICD-10-CM | POA: Diagnosis not present

## 2016-12-06 MED ORDER — LISINOPRIL-HYDROCHLOROTHIAZIDE 20-25 MG PO TABS: 1.0000 | ORAL_TABLET | Freq: Every day | ORAL | 1 refills | Status: DC

## 2016-12-13 DIAGNOSIS — R300 Dysuria: Secondary | ICD-10-CM | POA: Diagnosis not present

## 2016-12-13 DIAGNOSIS — L03311 Cellulitis of abdominal wall: Secondary | ICD-10-CM | POA: Diagnosis not present

## 2016-12-13 DIAGNOSIS — N898 Other specified noninflammatory disorders of vagina: Secondary | ICD-10-CM | POA: Diagnosis not present

## 2017-01-18 ENCOUNTER — Ambulatory Visit: Payer: Medicare Other | Admitting: Family Medicine

## 2017-01-24 DIAGNOSIS — H1712 Central corneal opacity, left eye: Secondary | ICD-10-CM | POA: Diagnosis not present

## 2017-01-24 DIAGNOSIS — H16143 Punctate keratitis, bilateral: Secondary | ICD-10-CM | POA: Diagnosis not present

## 2017-02-14 ENCOUNTER — Ambulatory Visit: Payer: Medicare Other | Attending: Internal Medicine | Admitting: Internal Medicine

## 2017-02-14 ENCOUNTER — Encounter: Payer: Self-pay | Admitting: Internal Medicine

## 2017-02-14 VITALS — BP 125/68 | HR 77 | Temp 98.5°F | Resp 16 | Wt 193.4 lb

## 2017-02-14 DIAGNOSIS — R519 Headache, unspecified: Secondary | ICD-10-CM

## 2017-02-14 DIAGNOSIS — R51 Headache: Secondary | ICD-10-CM | POA: Insufficient documentation

## 2017-02-14 DIAGNOSIS — Z823 Family history of stroke: Secondary | ICD-10-CM | POA: Insufficient documentation

## 2017-02-14 DIAGNOSIS — G5603 Carpal tunnel syndrome, bilateral upper limbs: Secondary | ICD-10-CM | POA: Diagnosis not present

## 2017-02-14 DIAGNOSIS — Z9889 Other specified postprocedural states: Secondary | ICD-10-CM | POA: Diagnosis not present

## 2017-02-14 DIAGNOSIS — M25512 Pain in left shoulder: Secondary | ICD-10-CM

## 2017-02-14 DIAGNOSIS — R269 Unspecified abnormalities of gait and mobility: Secondary | ICD-10-CM

## 2017-02-14 DIAGNOSIS — I10 Essential (primary) hypertension: Secondary | ICD-10-CM | POA: Insufficient documentation

## 2017-02-14 DIAGNOSIS — M1990 Unspecified osteoarthritis, unspecified site: Secondary | ICD-10-CM | POA: Diagnosis not present

## 2017-02-14 DIAGNOSIS — M48061 Spinal stenosis, lumbar region without neurogenic claudication: Secondary | ICD-10-CM | POA: Insufficient documentation

## 2017-02-14 DIAGNOSIS — Z79899 Other long term (current) drug therapy: Secondary | ICD-10-CM | POA: Insufficient documentation

## 2017-02-14 DIAGNOSIS — R1013 Epigastric pain: Secondary | ICD-10-CM | POA: Diagnosis not present

## 2017-02-14 MED ORDER — OMEPRAZOLE 20 MG PO CPDR
20.0000 mg | DELAYED_RELEASE_CAPSULE | Freq: Every day | ORAL | 3 refills | Status: DC
Start: 2017-02-14 — End: 2017-06-15

## 2017-02-14 MED ORDER — DICLOFENAC SODIUM 1 % TD GEL: 2.0000 g | Freq: Four times a day (QID) | TRANSDERMAL | 2 refills | Status: DC

## 2017-02-14 NOTE — Progress Notes (Signed)
Patient ID: Shelly Meyer, female    DOB: 07/15/1950  MRN: [id number]  CC: re-establish; Hypertension; Back Pain; Abdominal Pain; Shoulder Pain; Numbness (b/l hand); and Dizziness   Subjective: Shelly Meyer is a 67 y.o. female who presents to re-est care. Last seen 10/2015 with Dr. Jarold Song. Daughter, Bethena Roys, is with her. Her concerns today include:  Pt with hx of CTS, depression, HTN, OA, spinal stenosis, varicose veins s/p laser ablation RT great saph vein 04/2016  Pt presents with multiple concerns.  C/o generalized abdominal pain x 1 wk -pain constant, like cramp and abdominal feels swollen and bloated -no associated N/V/D -no worse or better with food. -nothing makes it worse, nothing makes it better.  -taking Tylenol Q 8 hrs PRN. Last took it 2 days ago. -does not eat spicy foods.   Pain over LT shoulder blade x 1 mth -constant. No initiating factors -some numbness and tingling in both hands a nights  -no cough or SOB  Feels unbalance in mornings when first gets up x several mths. No falls +intermittent HA all over for same time period .  Patient Active Problem List   Diagnosis Date Noted  . Osteoarthritis 10/29/2015  . Spinal stenosis of lumbar region 02/28/2015  . Carpal tunnel syndrome 02/28/2015  . Varicose veins of bilateral lower extremities with other complications 90/24/0973  . Achilles tendinitis of left lower extremity 02/28/2015  . Depression 02/28/2015     Current Outpatient Medications on File Prior to Visit  Medication Sig Dispense Refill  . lisinopril-hydrochlorothiazide (PRINZIDE,ZESTORETIC) 20-25 MG tablet Take 1 tablet by mouth daily. 30 tablet 1   No current facility-administered medications on file prior to visit.     Not on File  Social History   Socioeconomic History  . Marital status: Divorced    Spouse name: Not on file  . Number of children: Not on file  . Years of education: Not on file  . Highest education level: Not on file  Social  Needs  . Financial resource strain: Not on file  . Food insecurity - worry: Not on file  . Food insecurity - inability: Not on file  . Transportation needs - medical: Not on file  . Transportation needs - non-medical: Not on file  Occupational History  . Not on file  Tobacco Use  . Smoking status: Never Smoker  . Smokeless tobacco: Never Used  Substance and Sexual Activity  . Alcohol use: No  . Drug use: No  . Sexual activity: Not on file  Other Topics Concern  . Not on file  Social History Narrative  . Not on file    Family History  Problem Relation Age of Onset  . Stroke Mother     Past Surgical History:  Procedure Laterality Date  . ENDOVENOUS ABLATION SAPHENOUS VEIN W/ LASER Right 05/14/2016   endovenous laser ablation right greater saphenous vein by Tinnie Gens MD     ROS: Review of Systems As above PHYSICAL EXAM: BP 125/68   Pulse 77   Temp 98.5 F (36.9 C) (Oral)   Resp 16   Wt 193 lb 6.4 oz (87.7 kg)   LMP  (LMP Unknown)   SpO2 95%   BMI 35.37 kg/m   BP sitting 122/72 P 72; standing 115/74, P 73 Physical Exam General appearance - alert, well appearing, and in no distress Mental status - alert, oriented to person, place, and time, normal mood, behavior, speech, dress, motor activity, and thought processes Mouth - mucous membranes  moist, pharynx normal without lesions Neck - supple, no significant adenopathy Chest - clear to auscultation, no wheezes, rales or rhonchi, symmetric air entry Heart - normal rate, regular rhythm, normal S1, S2, no murmurs, rubs, clicks or gallops Abdomen - obese, 2 small scabbed over sores on RT sided abd wall. Soft, mild diffuse tenderness without guarding/rebound.  No organomegaly Neurological - cranial nerves II through XII intact, normal muscle tone, no tremors, strength 5/5. Romberg is negative.  Gait is steady except she was noted to get off balance with a turn once. Tinel's pos BL MSK: neck supple. Mild tenderness on  palpation over LT scapular and anterior LT shoulder. Mild discomfort with passive ROM LT shoulder.  Drop arm test neg  ASSESSMENT AND PLAN: 1. Dyspepsia We will check a H. pylori test.  Start on omeprazole.  GERD precautions discussed.  She is not on any NSAIDs and I advised her not to take any. - CBC - Comprehensive metabolic panel - omeprazole (PRILOSEC) 20 MG capsule; Take 1 capsule (20 mg total) by mouth daily.  Dispense: 30 capsule; Refill: 3 - H. pylori breath test  2. Acute pain of left shoulder - DG Shoulder Left; Future - diclofenac sodium (VOLTAREN) 1 % GEL; Apply 2 g topically 4 (four) times daily.  Dispense: 100 g; Refill: 2  3. Bilateral carpal tunnel syndrome Discussed dx and treatment.  Rxn given for BL cockup wrist splints - DME Other see comment  4. Nonintractable headache, unspecified chronicity pattern, unspecified headache type 5. Gait disturbance -othostat BP/pulse neg - CT Head Wo Contrast; Future If CT head neg, may refer for P.T  Pt had other concerns but she was advised that we would have to pick them up on her next visit. Patient was given the opportunity to ask questions.  Patient verbalized understanding of the plan and was able to repeat key elements of the plan.  [employer] interpreter used during this encounter.     Requested Prescriptions   Signed Prescriptions Disp Refills  . omeprazole (PRILOSEC) 20 MG capsule 30 capsule 3    Sig: Take 1 capsule (20 mg total) by mouth daily.  . diclofenac sodium (VOLTAREN) 1 % GEL 100 g 2    Sig: Apply 2 g topically 4 (four) times daily.    Return in about 1 month (around 03/17/2017).  Karle Plumber, MD, FACP

## 2017-02-15 LAB — COMPREHENSIVE METABOLIC PANEL
ALBUMIN: 4.6 g/dL (ref 3.6–4.8)
ALT: 27 IU/L (ref 0–32)
AST: 22 IU/L (ref 0–40)
Albumin/Globulin Ratio: 1.8 (ref 1.2–2.2)
Alkaline Phosphatase: 79 IU/L (ref 39–117)
BILIRUBIN TOTAL: 0.3 mg/dL (ref 0.0–1.2)
BUN / CREAT RATIO: 19 (ref 12–28)
BUN: 10 mg/dL (ref 8–27)
CALCIUM: 9.7 mg/dL (ref 8.7–10.3)
CO2: 24 mmol/L (ref 20–29)
CREATININE: 0.53 mg/dL — AB (ref 0.57–1.00)
Chloride: 103 mmol/L (ref 96–106)
GFR, EST AFRICAN AMERICAN: 113 mL/min/{1.73_m2} (ref >59)
GFR, EST NON AFRICAN AMERICAN: 98 mL/min/{1.73_m2} (ref >59)
GLUCOSE: 103 mg/dL — AB (ref 65–99)
Globulin, Total: 2.5 g/dL (ref 1.5–4.5)
Potassium: 4 mmol/L (ref 3.5–5.2)
Sodium: 144 mmol/L (ref 134–144)
TOTAL PROTEIN: 7.1 g/dL (ref 6.0–8.5)

## 2017-02-15 LAB — CBC
HEMOGLOBIN: 13.7 g/dL (ref 11.1–15.9)
Hematocrit: 40 % (ref 34.0–46.6)
MCH: 30 pg (ref 26.6–33.0)
MCHC: 34.3 g/dL (ref 31.5–35.7)
MCV: 88 fL (ref 79–97)
Platelets: 273 10*3/uL (ref 150–379)
RBC: 4.56 x10E6/uL (ref 3.77–5.28)
RDW: 13.1 % (ref 12.3–15.4)
WBC: 9.1 10*3/uL (ref 3.4–10.8)

## 2017-02-15 LAB — H. PYLORI BREATH TEST: H pylori Breath Test: NEGATIVE

## 2017-02-17 ENCOUNTER — Ambulatory Visit (HOSPITAL_COMMUNITY)
Admission: RE | Admit: 2017-02-17 | Discharge: 2017-02-17 | Disposition: A | Discharge status: Home / Self Care | Payer: Medicare Other | Source: Ambulatory Visit | Attending: Internal Medicine | Admitting: Internal Medicine

## 2017-02-17 DIAGNOSIS — S4992XA Unspecified injury of left shoulder and upper arm, initial encounter: Secondary | ICD-10-CM | POA: Diagnosis not present

## 2017-02-17 DIAGNOSIS — R51 Headache: Secondary | ICD-10-CM | POA: Insufficient documentation

## 2017-02-17 DIAGNOSIS — R918 Other nonspecific abnormal finding of lung field: Secondary | ICD-10-CM | POA: Insufficient documentation

## 2017-02-17 DIAGNOSIS — R269 Unspecified abnormalities of gait and mobility: Secondary | ICD-10-CM | POA: Insufficient documentation

## 2017-02-17 DIAGNOSIS — M25512 Pain in left shoulder: Secondary | ICD-10-CM | POA: Insufficient documentation

## 2017-02-17 DIAGNOSIS — R519 Headache, unspecified: Secondary | ICD-10-CM

## 2017-02-17 DIAGNOSIS — R42 Dizziness and giddiness: Secondary | ICD-10-CM | POA: Diagnosis not present

## 2017-02-20 ENCOUNTER — Other Ambulatory Visit: Payer: Self-pay | Admitting: Internal Medicine

## 2017-02-20 DIAGNOSIS — R9389 Abnormal findings on diagnostic imaging of other specified body structures: Secondary | ICD-10-CM

## 2017-02-22 DIAGNOSIS — H04123 Dry eye syndrome of bilateral lacrimal glands: Secondary | ICD-10-CM | POA: Diagnosis not present

## 2017-02-22 DIAGNOSIS — H1712 Central corneal opacity, left eye: Secondary | ICD-10-CM | POA: Diagnosis not present

## 2017-02-22 DIAGNOSIS — H5212 Myopia, left eye: Secondary | ICD-10-CM | POA: Diagnosis not present

## 2017-02-22 DIAGNOSIS — H52221 Regular astigmatism, right eye: Secondary | ICD-10-CM | POA: Diagnosis not present

## 2017-02-22 DIAGNOSIS — H2511 Age-related nuclear cataract, right eye: Secondary | ICD-10-CM | POA: Diagnosis not present

## 2017-02-22 DIAGNOSIS — H25812 Combined forms of age-related cataract, left eye: Secondary | ICD-10-CM | POA: Diagnosis not present

## 2017-02-22 DIAGNOSIS — H5201 Hypermetropia, right eye: Secondary | ICD-10-CM | POA: Diagnosis not present

## 2017-02-22 DIAGNOSIS — H524 Presbyopia: Secondary | ICD-10-CM | POA: Diagnosis not present

## 2017-02-22 DIAGNOSIS — H25012 Cortical age-related cataract, left eye: Secondary | ICD-10-CM | POA: Diagnosis not present

## 2017-02-25 ENCOUNTER — Telehealth: Payer: Self-pay

## 2017-02-25 NOTE — Telephone Encounter (Signed)
[place] Id# [id number] contacted pt to go over lab results pt didn't answer lvm asking pt to give me a call at her earliest convenience.   If the pt calls back please give lab, ct and xray results:   kidney and liver function are normal. Blood count is normal.   CT of head was okay.  X-ray of Left shoulder reveals no arthritis changes. There was a question of a spot seen near left lung. I will order chest xray for further evaluation Return to radiology at her convenience to have this done.Marland Kitchen

## 2017-02-28 ENCOUNTER — Other Ambulatory Visit: Payer: Self-pay | Admitting: Internal Medicine

## 2017-02-28 DIAGNOSIS — I10 Essential (primary) hypertension: Secondary | ICD-10-CM

## 2017-03-10 ENCOUNTER — Ambulatory Visit (HOSPITAL_COMMUNITY)
Admission: RE | Admit: 2017-03-10 | Discharge: 2017-03-10 | Disposition: A | Discharge status: Home / Self Care | Payer: Medicare Other | Source: Ambulatory Visit | Attending: Internal Medicine | Admitting: Internal Medicine

## 2017-03-10 DIAGNOSIS — R918 Other nonspecific abnormal finding of lung field: Secondary | ICD-10-CM | POA: Diagnosis not present

## 2017-03-10 DIAGNOSIS — R9389 Abnormal findings on diagnostic imaging of other specified body structures: Secondary | ICD-10-CM | POA: Diagnosis not present

## 2017-03-11 ENCOUNTER — Telehealth: Payer: Self-pay

## 2017-03-11 DIAGNOSIS — H2513 Age-related nuclear cataract, bilateral: Secondary | ICD-10-CM | POA: Diagnosis not present

## 2017-03-11 NOTE — Telephone Encounter (Signed)
[place] Id# [id number] contacted pt to go over xray results   Pt is requesting medicine for the pain and [facility]

## 2017-03-16 ENCOUNTER — Ambulatory Visit: Payer: Medicare Other | Admitting: Family Medicine

## 2017-03-16 NOTE — Telephone Encounter (Signed)
Shelly Meyer interpreted for me. Pt stated that she is using the voltaren gel and it does help.

## 2017-03-18 MED ORDER — ACETAMINOPHEN 500 MG PO TABS: 500.0000 mg | ORAL_TABLET | Freq: Four times a day (QID) | ORAL | 1 refills | Status: DC | PRN

## 2017-03-18 NOTE — Addendum Note (Signed)
Addended by: Karle Plumber B on: 03/18/2017 08:12 AM   Modules accepted: Orders

## 2017-03-18 NOTE — Telephone Encounter (Signed)
Rxn for Tyl[facility]ol [facility]

## 2017-03-21 DIAGNOSIS — H2512 Age-related nuclear cataract, left eye: Secondary | ICD-10-CM | POA: Diagnosis not present

## 2017-03-21 DIAGNOSIS — H25812 Combined forms of age-related cataract, left eye: Secondary | ICD-10-CM | POA: Diagnosis not present

## 2017-04-12 ENCOUNTER — Ambulatory Visit: Payer: Medicare Other | Admitting: Internal Medicine

## 2017-04-14 ENCOUNTER — Encounter: Payer: Self-pay | Admitting: Internal Medicine

## 2017-04-14 ENCOUNTER — Ambulatory Visit: Payer: Medicare Other | Attending: Internal Medicine | Admitting: Internal Medicine

## 2017-04-14 ENCOUNTER — Other Ambulatory Visit: Payer: Self-pay | Admitting: Internal Medicine

## 2017-04-14 VITALS — BP 131/86 | HR 82 | Temp 98.1°F | Resp 16 | Wt 193.0 lb

## 2017-04-14 DIAGNOSIS — M1990 Unspecified osteoarthritis, unspecified site: Secondary | ICD-10-CM | POA: Diagnosis not present

## 2017-04-14 DIAGNOSIS — I10 Essential (primary) hypertension: Secondary | ICD-10-CM | POA: Diagnosis not present

## 2017-04-14 DIAGNOSIS — Z23 Encounter for immunization: Secondary | ICD-10-CM | POA: Diagnosis not present

## 2017-04-14 DIAGNOSIS — M48061 Spinal stenosis, lumbar region without neurogenic claudication: Secondary | ICD-10-CM | POA: Diagnosis not present

## 2017-04-14 DIAGNOSIS — F329 Major depressive disorder, single episode, unspecified: Secondary | ICD-10-CM | POA: Diagnosis not present

## 2017-04-14 DIAGNOSIS — Z79899 Other long term (current) drug therapy: Secondary | ICD-10-CM | POA: Insufficient documentation

## 2017-04-14 DIAGNOSIS — G43009 Migraine without aura, not intractable, without status migrainosus: Secondary | ICD-10-CM | POA: Diagnosis not present

## 2017-04-14 DIAGNOSIS — E669 Obesity, unspecified: Secondary | ICD-10-CM | POA: Diagnosis not present

## 2017-04-14 DIAGNOSIS — Z9889 Other specified postprocedural states: Secondary | ICD-10-CM | POA: Diagnosis not present

## 2017-04-14 DIAGNOSIS — D229 Melanocytic nevi, unspecified: Secondary | ICD-10-CM

## 2017-04-14 DIAGNOSIS — Z6835 Body mass index (BMI) 35.0-35.9, adult: Secondary | ICD-10-CM | POA: Insufficient documentation

## 2017-04-14 DIAGNOSIS — Z131 Encounter for screening for diabetes mellitus: Secondary | ICD-10-CM | POA: Diagnosis not present

## 2017-04-14 DIAGNOSIS — G8929 Other chronic pain: Secondary | ICD-10-CM | POA: Insufficient documentation

## 2017-04-14 DIAGNOSIS — G5600 Carpal tunnel syndrome, unspecified upper limb: Secondary | ICD-10-CM | POA: Insufficient documentation

## 2017-04-14 DIAGNOSIS — M25512 Pain in left shoulder: Secondary | ICD-10-CM | POA: Insufficient documentation

## 2017-04-14 DIAGNOSIS — D2239 Melanocytic nevi of other parts of face: Secondary | ICD-10-CM | POA: Diagnosis not present

## 2017-04-14 LAB — GLUCOSE, POCT (MANUAL RESULT ENTRY): POC Glucose: 103 mg/dl — AB (ref 70–99)

## 2017-04-14 LAB — POCT GLYCOSYLATED HEMOGLOBIN (HGB A1C): HEMOGLOBIN A1C: 5.5

## 2017-04-14 MED ORDER — TOPIRAMATE 25 MG PO TABS
25.0000 mg | ORAL_TABLET | Freq: Every day | ORAL | 1 refills | Status: DC
Start: 2017-04-14 — End: 2017-04-19

## 2017-04-14 NOTE — Patient Instructions (Signed)
Vacuna antineumoccica conjugada (PCV13): lo que debe saber (Pneumococcal Conjugate Vaccine [PCV13] What You Need to Know) 1. Por qu vacunarse? La vacunacin puede proteger a los nios y a los adultos de la enfermedad neumoccica. La enfermedad neumoccica es causada por una bacteria que puede contagiarse de [place] persona a otra por contacto directo. Puede provocar infecciones en los odos y tambin infecciones ms graves en:  los pulmones (neumona),  la sangre (bacteriemia), y  las membranas que cubren el cerebro y la mdula espinal (meningitis). La neumona neumoccica es ms frecuente en los adultos. La meningitis neumoccica puede causar sordera y dao cerebral, y Simonne Maffucci a aproximadamente 1 de cada 10 nios que contraen la enfermedad. Cualquier persona puede Emergency planning/management officer enfermedad neumoccica, pero los nios menores de 2 aos y los adultos mayores de 60 aos, las personas con determinadas enfermedades y los fumadores son los que corren un mayor riesgo. Antes de que existiera una vacuna, cada ao, en [place] se registraron los siguientes casos:  ms de 700 casos de meningitis,  unas 13000 infecciones de la [place],  cerca de 5 millones de infecciones del odo, y  alrededor de 200 muertes por la enfermedad neumoccica en nios menores de 5 aos. A partir de que la vacuna estuvo disponible, la enfermedad neumoccica grave en los nios ha disminuido un 88%. Cada ao, mueren alrededor de [employer] a causa de la enfermedad [employer] [place]. El tratamiento de las infecciones neumoccicas con penicilina y otros frmacos no es tan eficaz como sola serlo, dado que algunas cepas de la bacteria que causa la enfermedad se han vuelto resistentes a estos frmacos. Esto hace que la prevencin de la enfermedad mediante la vacunacin sea an ms importante. 2. Edward Jolly PCV13 La vacuna antineumoccica conjugada (llamada PCV13) protege contra 13tipos de bacterias  neumoccicas. La PCV13 se administra rutinariamente a los nios a los 2, 4, 6 y de 12 a 15 meses. Tambin se recomienda para los nios y Enoch de 2 a 52aos con ciertas enfermedades y para todos los adultos de 65 aos o ms. Su mdico le puede dar ms detalles. 3. Algunas personas no deben recibir la vacuna Cualquiera que haya tenido alguna vez una reaccin alrgica potencialmente mortal a una dosis de esta vacuna, o a una vacuna anterior neumoccica llamada PCV7, o a cualquier vacuna que contiene toxoide de difteria (por ejemplo, DTaP), no debe recibir la PCV13. Las personas que tengan Set designer graves a algn componente de la vacuna PCV13 no deben recibir esa vacuna. Informe al mdico si la persona que se vacunar sufre algn tipo de alergia grave. Si la persona prevista para la vacunacin est enferma, el mdico podra decidir reprogramar la vacuna para Administrator, arts. 4. Riesgos de [place] reaccin a la vacuna Con cualquier medicamento, incluidas las vacunas, existe la posibilidad de que [employer]. Estas suelen ser leves y desaparecer por s solas, pero tambin es posible que se presenten reacciones graves. Los problemas informados despus de Glass blower/designer la PCV13 variaban con la edad y la dosis de la serie. Los problemas ms frecuentes informados en los nios fueron los siguientes:  Alrededor de la mitad se puso somnoliento despus de la inyeccin, tuvo una prdida transitoria del apetito, enrojecimiento o sinti dolor con la palpacin en donde se le aplic la inyeccin.  Aproximadamente 1 de cada 3 tuvo hinchazn en donde se le aplic la inyeccin.  Aproximadamente 1 de cada 3 tuvo fiebre baja y alrededor de 1de cada 20tuvo fiebre de ms  de 102,37F (39C).  Hasta 8 de cada 10 nios se ponen fastidiosos o irritables. Los adultos han Airline pilot, enrojecimiento e hinchazn donde se les aplic la vacuna; tambin fiebre baja, fatiga, dolor de cabeza, escalofros o dolor muscular. Los nios  pequeos, que reciben la vacuna PCV13 y la vacuna antigripal inactivada al mismo tiempo, pueden tener un mayor riesgo de sufrir convulsiones causadas por fiebre. Consulte al mdico para obtener ms informacin. Problemas que podran ocurrir despus de cualquier vacuna:  Las personas a veces se desmayan despus de un procedimiento mdico, incluida la vacunacin. Permanecer sentado o recostado durante 10minutos puede ayudar a [employer] y las lesiones causadas por las cadas. Informe al mdico si se siente mareado, tiene cambios en la visin o zumbidos en los odos.  Algunos nios mayores y adultos sienten un dolor intenso en el hombro y tienen dificultad para mover el brazo donde se coloc la vacuna. Esto sucede con muy poca frecuencia.  Cualquier medicamento puede causar una reaccin alrgica grave. Dichas reacciones son Orlene Erm poco frecuentes con una vacuna (se calcula que menos de 1en un milln de dosis) y se producen unos minutos a unas horas despus de la vacunacin. Al igual que con cualquier [employer], existe una probabilidad muy pequea de que una vacuna cause una lesin grave o la muerte. Se controla permanentemente la seguridad de las vacunas. Para obtener ms informacin, visite: http://www.aguilar.org/. 5. Qu pasa si hay una reaccin grave? A qu signos debo estar atento?  Observe todo lo que le preocupe, como signos de una reaccin alrgica grave, fiebre muy alta o comportamiento fuera de lo normal. Los signos de una reaccin alrgica grave pueden incluir ronchas, hinchazn de la cara y la garganta, dificultad para respirar, latidos cardacos acelerados, mareos y debilidad, generalmente en el lapso de unos minutos a unas horas despus de la vacunacin. Qu debo hacer?  Si usted piensa que se trata de una reaccin alrgica grave o de otra emergencia que no puede esperar, llame al 911 o lleve a la persona al hospital ms cercano. De lo contrario, llame al [employer]. Las  reacciones deben informarse al Sistema de Informacin sobre Efectos Adversos de las Clinical biochemist (Vaccine Adverse Event Reporting System, VAERS). El mdico debe presentar este informe, o bien puede hacerlo usted mismo a travs del sitio web de VAERS, en www.vaers.SamedayNews.es, o llamando al 978-419-3335. VAERS no brinda recomendaciones mdicas. 6. [place] Compensacin de Daos por [place] de Compensacin de Daos por Clinical biochemist (National Vaccine Injury Compensation Program, VICP) es un programa federal que fue creado para Patent examiner a las personas que puedan haber sufrido daos al recibir ciertas vacunas. Aquellas personas que consideren que han sufrido un dao como consecuencia de una vacuna y [place] saber ms acerca del programa y de cmo presentar un reclamo, pueden llamar al 1-586-609-8382 o visitar el sitio web del VICP en GoldCloset.com.ee. Hay un lmite de tiempo para presentar un reclamo de compensacin. 7. Cmo puedo obtener ms informacin?  Pregntele al mdico. Este puede darle el prospecto de la vacuna o recomendarle otras fuentes de informacin.  Comunquese con el servicio de salud de su localidad o su estado.  Comunquese con los Centros para el Control y la Prevencin de Probation officer for Disease Control and Prevention, CDC): ? Llame al [phone] (1-800-CDC-INFO) o ? visite el sitio web [employer] en http://hunter.com/. Declaracin de informacin de la vacuna Vacuna PCV13 (12/13/2013) Esta informacin no tiene Marine scientist el consejo del mdico. Chief Strategy Officer  de hacerle al mdico cualquier pregunta que tenga. Document Released: 07/14/2007 Document Revised: 02/15/2014 Document Reviewed: 12/20/2013 Elsevier Interactive Patient Education  2017 [place].   Influenza Virus Vaccine injection (Fluarix) Qu es este medicamento? La VACUNA ANTIGRIPAL ayuda a disminuir el riesgo de contraer la influenza, tambin conocida como  la gripe. La vacuna solo ayuda a protegerle contra algunas cepas de influenza. Esta vacuna no ayuda a reducir Catering manager de contraer influenza pandmica H1N1. Este medicamento puede ser utilizado para otros usos; si tiene alguna pregunta consulte con su proveedor de atencin mdica o con su farmacutico. MARCAS COMUNES: Fluarix, Fluzone Qu le debo informar a mi profesional de la salud antes de tomar este medicamento? Necesita saber si usted presenta alguno de los siguientes problemas o situaciones: -trastorno de sangrado como hemofilia -fiebre o infeccin -sndrome de Guillain-Barre u otros problemas neurolgicos -problemas del sistema inmunolgico -infeccin por el virus de la inmunodeficiencia humana (VIH) o SIDA -niveles bajos de plaquetas en la sangre -esclerosis mltiple -una Risk analyst o inusual a las vacunas antigripales, a los huevos, protenas de pollo, al ltex, a la gentamicina, a otros medicamentos, alimentos, colorantes o conservantes -si est embarazada o buscando quedar embarazada -si est amamantando a un beb Cmo debo utilizar este medicamento? Esta vacuna se administra mediante inyeccin por va intramuscular. Lo administra un profesional de [employer]	[place]. Recibir una copia de informacin escrita sobre la vacuna antes de cada vacuna. Asegrese de leer este folleto cada vez cuidadosamente. Este folleto puede cambiar con frecuencia. Hable con su pediatra para informarse acerca del uso de este medicamento en nios. Puede requerir atencin especial. Sobredosis: Pngase en contacto inmediatamente con un centro toxicolgico o una sala de urgencia si usted cree que haya tomado demasiado medicamento. ATENCIN: [employer] es solo para usted. No comparta este medicamento con nadie. Qu sucede si me olvido de una dosis? No se aplica en este caso. Qu puede interactuar con este medicamento? -quimioterapia o radioterapia -medicamentos que suprimen el sistema inmunolgico,  tales como etanercept, anakinra, infliximab y adalimumab -medicamentos que tratan o previenen cogulos sanguneos, como warfarina -fenitona -medicamentos esteroideos, como la prednisona o la cortisona -teofilina -vacunas Puede ser que esta lista no menciona todas las posibles interacciones. Informe a su profesional de [employer]	[place] de [employer] productos a base de hierbas, medicamentos de [place] o suplementos nutritivos que est tomando. Si usted fuma, consume bebidas alcohlicas o si utiliza drogas ilegales, indqueselo tambin a su profesional de [employer]	[place]. Algunas sustancias pueden interactuar con su medicamento. A qu debo estar atento al usar [employer]? Informe a su mdico o a Barrister's clerk de la [employer] todos los efectos secundarios que persistan despus de 3 das. Llame a su proveedor de atencin mdica si se presentan sntomas inusuales dentro de las 6 semanas posteriores a la vacunacin. Es posible que todava pueda contraer la gripe, pero la enfermedad no ser tan fuerte como normalmente. No puede contraer la gripe de esta vacuna. La vacuna antigripal no le protege contra resfros u otras enfermedades que pueden causar [place]. Debe vacunarse cada ao. Qu efectos secundarios puedo tener al [employer] este medicamento? Efectos secundarios que debe informar a su mdico o a Barrister's clerk de la salud tan pronto como sea posible: -Chief of Staff como erupcin cutnea, picazn o urticarias, hinchazn de la cara, labios o lengua Efectos secundarios que, por lo general, no requieren atencin mdica (debe informarlos a su mdico o a su profesional de la salud si persisten o si son molestos): -fiebre -  dolor de cabeza -molestias y dolores musculares -dolor, sensibilidad, enrojecimiento o Estate agent de la inyeccin -cansancio o debilidad Puede ser que esta lista no menciona todos los posibles efectos secundarios. Comunquese a su mdico por asesoramiento mdico [employer]. Usted puede informar los efectos secundarios a la FDA por telfono al 1-800-FDA-1088. Dnde debo guardar mi medicina? Esta vacuna se administra solamente en clnicas, farmacias, consultorio mdico u otro consultorio de un profesional de la salud y no Sports coach en su domicilio. ATENCIN: Este folleto es un resumen. Puede ser que no cubra toda la posible informacin. Si usted tiene preguntas acerca de esta medicina, consulte con su mdico, su farmacutico o su profesional de Technical sales engineer.  2018 Elsevier/Gold Standard (2009-07-29 15:31:40)

## 2017-04-14 NOTE — Progress Notes (Signed)
Patient ID: Shelly Meyer, female    DOB: 1950-04-03  MRN: [id number]  CC: Follow-up   Subjective: Shelly Meyer is a 67 y.o. female who presents for 6 wks f/u.  Daughter, Shelly Meyer, is with her.  She requests that daughter interprets. Her concerns today include:    Her concerns today include:  Pt with hx of CTS, depression, HTN, OA, spinal stenosis, varicose veins s/p laser ablation RT great saph vein 04/2016  1.  LT shoulder: little better but still bothersome with movement.  She did not find the Voltaren gel helpful.  She is taking Tylenol.  2.  HA: On last visit she complained of intermittent headaches for several months.  CAT scan of the head was negative.  Last wk had HA for 2 days. Took regular Tylenol 1 Q 6 hrs. -episodes occur 1-2 x a wk and last 2 days. + blurred vision, photophobia -pressure on the eyes Cataract ext LT eye 3 wks ago with good results.  Needs to have RT eye done but she is afraid  Had PAP and MMG 11/2016 at [facility] last 11/2016. She will bring reports for our records.  Had flu shot at out side facility.  Patient Active Problem List   Diagnosis Date Noted  . Dyspepsia 02/14/2017  . Acute pain of left shoulder 02/14/2017  . Gait disturbance 02/14/2017  . Osteoarthritis 10/29/2015  . Spinal stenosis of lumbar region 02/28/2015  . Carpal tunnel syndrome 02/28/2015  . Varicose veins of bilateral lower extremities with other complications 09/32/3557  . Achilles tendinitis of left lower extremity 02/28/2015  . Depression 02/28/2015     Current Outpatient Medications on File Prior to Visit  Medication Sig Dispense Refill  . acetaminophen (TYLENOL) 500 MG tablet Take 1 tablet (500 mg total) by mouth every 6 (six) hours as needed for moderate pain. 60 tablet 1  . diclofenac sodium (VOLTAREN) 1 % GEL Apply 2 g topically 4 (four) times daily. 100 g 2  . lisinopril-hydrochlorothiazide (PRINZIDE,ZESTORETIC) 20-25 MG tablet TAKE 1 TABLET BY MOUTH EVERY DAY 90 tablet  0  . omeprazole (PRILOSEC) 20 MG capsule Take 1 capsule (20 mg total) by mouth daily. 30 capsule 3   No current facility-administered medications on file prior to visit.     Not on File  Social History   Socioeconomic History  . Marital status: Divorced    Spouse name: Not on file  . Number of children: Not on file  . Years of education: Not on file  . Highest education level: Not on file  Social Needs  . Financial resource strain: Not on file  . Food insecurity - worry: Not on file  . Food insecurity - inability: Not on file  . Transportation needs - medical: Not on file  . Transportation needs - non-medical: Not on file  Occupational History  . Not on file  Tobacco Use  . Smoking status: Never Smoker  . Smokeless tobacco: Never Used  Substance and Sexual Activity  . Alcohol use: No  . Drug use: No  . Sexual activity: Not on file  Other Topics Concern  . Not on file  Social History Narrative  . Not on file    Family History  Problem Relation Age of Onset  . Stroke Mother     Past Surgical History:  Procedure Laterality Date  . ENDOVENOUS ABLATION SAPHENOUS VEIN W/ LASER Right 05/14/2016   endovenous laser ablation right greater saphenous vein by Tinnie Gens MD  ROS: Review of Systems Skin: She is concerned about skin tags around her neck and a dark spot on her face and nose.  She thinks that she has bald spots in her hair  PHYSICAL EXAM: BP 131/86   Pulse 82   Temp 98.1 F (36.7 C) (Oral)   Resp 16   Wt 193 lb (87.5 kg)   LMP  (LMP Unknown)   SpO2 97%   BMI 35.30 kg/m   Physical Exam  General appearance - alert, well appearing, and in no distress Mental status - alert, oriented to person, place, and time, normal mood, behavior, speech, dress, motor activity, and thought processes Neck - supple, no significant adenopathy Chest - clear to auscultation, no wheezes, rales or rhonchi, symmetric air entry Heart - normal rate, regular rhythm, normal  S1, S2, no murmurs, rubs, clicks or gallops Neurological -cranial nerves grossly intact.  Actively moving all 4 extremities.  Gait is normal. Skin: Several small to medium sized skin tags noted around the neckline.  One seborrheic keratosis spot over the right temple.  Hyperpigmented mole at the tip of the nose  Results for orders placed or performed in visit on 04/14/17  POCT glucose (manual entry)  Result Value Ref Range   POC Glucose 103 (A) 70 - 99 mg/dl  POCT glycosylated hemoglobin (Hb A1C)  Result Value Ref Range   Hemoglobin A1C 5.5     ASSESSMENT AND PLAN: 1. Migraine without aura and without status migrainosus, not intractable Continue Tylenol for abortive therapy. We will start low-dose Topamax at bedtime to see if we can decrease the frequency of the headaches. - topiramate (TOPAMAX) 25 MG tablet; Take 1 tablet (25 mg total) by mouth at bedtime.  Dispense: 30 tablet; Refill: 1  2. Need for Streptococcus pneumoniae and influenza vaccination - Flu Vaccine QUAD 6+ mos PF IM (Fluarix Quad PF) - Pneumococcal conjugate vaccine 13-valent IM  3. Skin mole - Ambulatory referral to Dermatology  4. Chronic left shoulder pain - Ambulatory referral to Orthopedic Surgery  5. Obesity (BMI 35.0-39.9 without comorbidity) 6. Diabetes mellitus screening Diabetes screen was negative. - POCT glucose (manual entry) - POCT glycosylated hemoglobin (Hb A1C)  Patient was given the opportunity to ask questions.  Patient verbalized understanding of the plan and was able to repeat key elements of the plan.   Orders Placed This Encounter  Procedures  . Flu Vaccine QUAD 6+ mos PF IM (Fluarix Quad PF)  . Pneumococcal conjugate vaccine 13-valent IM  . Ambulatory referral to Orthopedic Surgery  . Ambulatory referral to Dermatology  . POCT glucose (manual entry)  . POCT glycosylated hemoglobin (Hb A1C)     Requested Prescriptions   Signed Prescriptions Disp Refills  . topiramate (TOPAMAX)  25 MG tablet 30 tablet 1    Sig: Take 1 tablet (25 mg total) by mouth at bedtime.    Return in about 2 months (around 06/14/2017).  Karle Plumber, MD, FACP

## 2017-04-19 ENCOUNTER — Telehealth: Payer: Self-pay | Admitting: Internal Medicine

## 2017-04-19 ENCOUNTER — Other Ambulatory Visit: Payer: Self-pay | Admitting: Pharmacist

## 2017-04-19 ENCOUNTER — Ambulatory Visit (INDEPENDENT_AMBULATORY_CARE_PROVIDER_SITE_OTHER): Payer: Medicare Other | Admitting: Orthopaedic Surgery

## 2017-04-19 ENCOUNTER — Encounter (INDEPENDENT_AMBULATORY_CARE_PROVIDER_SITE_OTHER): Payer: Self-pay | Admitting: Orthopaedic Surgery

## 2017-04-19 DIAGNOSIS — M7582 Other shoulder lesions, left shoulder: Secondary | ICD-10-CM

## 2017-04-19 DIAGNOSIS — G43009 Migraine without aura, not intractable, without status migrainosus: Secondary | ICD-10-CM

## 2017-04-19 MED ORDER — LIDOCAINE HCL 2 % IJ SOLN
2.0000 mL | INTRAMUSCULAR | Status: AC | PRN
  Administered 2017-04-19: 2 mL

## 2017-04-19 MED ORDER — TOPIRAMATE 25 MG PO TABS: 25.0000 mg | ORAL_TABLET | Freq: Every day | ORAL | 0 refills | Status: DC

## 2017-04-19 MED ORDER — METHYLPREDNISOLONE ACETATE 40 MG/ML IJ SUSP
40.0000 mg | INTRAMUSCULAR | Status: AC | PRN
  Administered 2017-04-19: 40 mg via INTRA_ARTICULAR

## 2017-04-19 MED ORDER — BUPIVACAINE HCL 0.25 % IJ SOLN
2.0000 mL | INTRAMUSCULAR | Status: AC | PRN
  Administered 2017-04-19: 2 mL via INTRA_ARTICULAR

## 2017-04-19 NOTE — Telephone Encounter (Signed)
Resent

## 2017-04-19 NOTE — Progress Notes (Signed)
Office Visit Note   Patient: Shelly Meyer           Date of Birth: 11-11-1950           MRN: [id number] Visit Date: 04/19/2017              Requested by: Ladell Pier, MD [address] [place], [place] [address] PCP: Ladell Pier, MD   Assessment & Plan: Visit Diagnoses:  1. Rotator cuff tendinitis, left     Plan: Impression is left shoulder subacromial bursitis.  Today, we injected her subacromial space with cortisone.  I also wrote her a prescription to start outpatient physical therapy so she does not develop adhesive capsulitis.  I told her to wait at least the week to start this.  She will follow-up with [place] as needed.  She will call with concerns or questions in the meantime.  Follow-Up Instructions: Return if symptoms worsen or fail to improve.   Orders:  Orders Placed This Encounter  Procedures  . Large Joint Inj: L subacromial bursa   No orders of the defined types were placed in this encounter.     Procedures: Large Joint Inj: L subacromial bursa on 04/19/2017 3:43 PM Indications: pain Details: 22 G needle Medications: 2 mL lidocaine 2 %; 2 mL bupivacaine 0.25 %; 40 mg methylPREDNISolone acetate 40 MG/ML Outcome: tolerated well, no immediate complications Patient was prepped and draped in the usual sterile fashion.       Clinical Data: No additional findings.   Subjective: Chief Complaint  Patient presents with  . Left Shoulder - Pain    HPI this is a pleasant 67 year old female new patient who presents our clinic today with left shoulder pain.  She states she fell approximately a year ago landing on her left shoulder.  She was seen by Dr. back in January 2019 where x-rays were obtained.  Negative for fracture.  She comes in today with continued pain.  She describes this as a constant ache to the entire shoulder radiating into the deltoid as well as the scapula.  Pain is worse with forward flexion and internal rotation.  She does note  occasional numbness and tingling to the left hand but she also says she has a history of carpal tunnel syndrome.  These are not new symptoms.  Review of Systems as detailed in HPI.  All are reviewed and are negative.   Objective: Vital Signs: LMP  (LMP Unknown)   Physical Exam well-developed well-nourished female in no acute distress.  Alert and oriented x3.  Ortho Exam examination of her left shoulder reveals full forward flexion and abduction.  She can internally rotate to her side but nothing more.  She has a positive empty can.  She is neurovascular intact distally.  Specialty Comments:  No specialty comments available.  Imaging: No new imaging today   PMFS History: Patient Active Problem List   Diagnosis Date Noted  . Rotator cuff tendinitis, left 04/19/2017  . Dyspepsia 02/14/2017  . Acute pain of left shoulder 02/14/2017  . Gait disturbance 02/14/2017  . Osteoarthritis 10/29/2015  . Spinal stenosis of lumbar region 02/28/2015  . Carpal tunnel syndrome 02/28/2015  . Varicose veins of bilateral lower extremities with other complications 19/14/7829  . Achilles tendinitis of left lower extremity 02/28/2015  . Depression 02/28/2015   Past Medical History:  Diagnosis Date  . Carpal tunnel syndrome 02/28/2015  . Depression 02/28/2015  . Hypertension   . Osteoarthritis   . Spinal stenosis  of lumbar region 02/28/2015  . Varicose veins of both lower extremities     Family History  Problem Relation Age of Onset  . Stroke Mother     Past Surgical History:  Procedure Laterality Date  . ENDOVENOUS ABLATION SAPHENOUS VEIN W/ LASER Right 05/14/2016   endovenous laser ablation right greater saphenous vein by Tinnie Gens MD    Social History   Occupational History  . Not on file  Tobacco Use  . Smoking status: Never Smoker  . Smokeless tobacco: Never Used  Substance and Sexual Activity  . Alcohol use: No  . Drug use: No  . Sexual activity: Not on file

## 2017-04-19 NOTE — Telephone Encounter (Signed)
Pt called since was sent the prescription to the wrong pharmacy, please sent topiramate (TOPAMAX) 25 MG tablet To the pharmacy on [facility] on [address] please pt is waiting for it

## 2017-05-26 ENCOUNTER — Other Ambulatory Visit: Payer: Self-pay | Admitting: Pharmacist

## 2017-05-26 DIAGNOSIS — I10 Essential (primary) hypertension: Secondary | ICD-10-CM

## 2017-05-26 MED ORDER — LISINOPRIL-HYDROCHLOROTHIAZIDE 20-25 MG PO TABS: 1.0000 | ORAL_TABLET | Freq: Every day | ORAL | 0 refills | Status: DC

## 2017-06-10 ENCOUNTER — Encounter (HOSPITAL_COMMUNITY): Payer: Self-pay

## 2017-06-10 ENCOUNTER — Telehealth (HOSPITAL_COMMUNITY): Payer: Self-pay | Admitting: Emergency Medicine

## 2017-06-10 ENCOUNTER — Other Ambulatory Visit: Payer: Self-pay | Admitting: Family Medicine

## 2017-06-10 ENCOUNTER — Ambulatory Visit (HOSPITAL_COMMUNITY)
Admission: EM | Admit: 2017-06-10 | Discharge: 2017-06-10 | Disposition: A | Discharge status: Home / Self Care | Payer: Medicare Other | Attending: Urgent Care | Admitting: Urgent Care

## 2017-06-10 ENCOUNTER — Other Ambulatory Visit: Payer: Self-pay

## 2017-06-10 DIAGNOSIS — E876 Hypokalemia: Secondary | ICD-10-CM | POA: Insufficient documentation

## 2017-06-10 DIAGNOSIS — I10 Essential (primary) hypertension: Secondary | ICD-10-CM | POA: Diagnosis not present

## 2017-06-10 DIAGNOSIS — R51 Headache: Secondary | ICD-10-CM

## 2017-06-10 DIAGNOSIS — E860 Dehydration: Secondary | ICD-10-CM | POA: Insufficient documentation

## 2017-06-10 DIAGNOSIS — H538 Other visual disturbances: Secondary | ICD-10-CM

## 2017-06-10 DIAGNOSIS — R42 Dizziness and giddiness: Secondary | ICD-10-CM | POA: Insufficient documentation

## 2017-06-10 DIAGNOSIS — M1990 Unspecified osteoarthritis, unspecified site: Secondary | ICD-10-CM | POA: Insufficient documentation

## 2017-06-10 DIAGNOSIS — R110 Nausea: Secondary | ICD-10-CM | POA: Insufficient documentation

## 2017-06-10 LAB — POCT I-STAT, CHEM 8
BUN: 8 mg/dL (ref 6–20)
CREATININE: 0.5 mg/dL (ref 0.44–1.00)
Calcium, Ion: 1.1 mmol/L — ABNORMAL LOW (ref 1.15–1.40)
Chloride: 101 mmol/L (ref 101–111)
GLUCOSE: 164 mg/dL — AB (ref 65–99)
HCT: 42 % (ref 36.0–46.0)
Hemoglobin: 14.3 g/dL (ref 12.0–15.0)
POTASSIUM: 3.1 mmol/L — AB (ref 3.5–5.1)
Sodium: 140 mmol/L (ref 135–145)
TCO2: 24 mmol/L (ref 22–32)

## 2017-06-10 LAB — POCT URINALYSIS DIP (DEVICE)
BILIRUBIN URINE: NEGATIVE
Glucose, UA: NEGATIVE mg/dL
Nitrite: NEGATIVE
PH: 6.5 (ref 5.0–8.0)
PROTEIN: NEGATIVE mg/dL
Specific Gravity, Urine: 1.02 (ref 1.005–1.030)
Urobilinogen, UA: 0.2 mg/dL (ref 0.0–1.0)

## 2017-06-10 MED ORDER — ONDANSETRON 4 MG PO TBDP: 4.0000 mg | ORAL_TABLET | Freq: Three times a day (TID) | ORAL | 0 refills | Status: DC | PRN

## 2017-06-10 MED ORDER — MECLIZINE HCL 25 MG PO TABS: 25.0000 mg | ORAL_TABLET | Freq: Three times a day (TID) | ORAL | 0 refills | Status: DC | PRN

## 2017-06-10 NOTE — Discharge Instructions (Signed)
Tome 2 litros de agua al dia. Mida su presion en casa cuando tiene mareos. Apunte esas medidas para discutir sus sintomas con su doctor de cabezera. Meclizine puede causar sueno, entonces tome solo una pastilla si padece de este efecto secundario. Zofran puede causar estrenimiento.

## 2017-06-10 NOTE — ED Provider Notes (Addendum)
MRN: [id number] DOB: [birth year]  Subjective:   Shelly [facility] is a 67 y.o. female presenting for 3-day history of dizziness, nausea without vomiting, blurry vision, headache mostly with her nausea.  She has tried Tylenol without much relief.  She does have a history of high blood pressure and is compliant with her medications.  Patient states that she was checked for diabetes a couple of months ago and was told she is prediabetic.  Denies confusion, sinus pain, sinus congestion, sore throat, cough, chest pain, shortness of breath, wheezing, belly pain, rashes.  Patient reports that she has an unhealthy diet and eats a lot of carbohydrates.  Denies smoking cigarettes.  Admits that due to her nausea she has not been eating very much.  She denies trouble with dysuria, urinary frequency, hematuria, constipation, diarrhea.  No current facility-administered medications for this encounter.   Current Outpatient Medications:  .  acetaminophen (TYLENOL) 500 MG tablet, Take 1 tablet (500 mg total) by mouth every 6 (six) hours as needed for moderate pain., Disp: 60 tablet, Rfl: 1 .  lisinopril-hydrochlorothiazide (PRINZIDE,ZESTORETIC) 20-25 MG tablet, Take 1 tablet by mouth daily., Disp: 90 tablet, Rfl: 0 .  diclofenac sodium (VOLTAREN) 1 % GEL, Apply 2 g topically 4 (four) times daily., Disp: 100 g, Rfl: 2 .  omeprazole (PRILOSEC) 20 MG capsule, Take 1 capsule (20 mg total) by mouth daily., Disp: 30 capsule, Rfl: 3 .  topiramate (TOPAMAX) 25 MG tablet, Take 1 tablet (25 mg total) by mouth at bedtime., Disp: 30 tablet, Rfl: 0    No Known Allergies   Past Medical History:  Diagnosis Date  . Carpal tunnel syndrome 02/28/2015  . Depression 02/28/2015  . Hypertension   . Osteoarthritis   . Spinal stenosis of lumbar region 02/28/2015  . Varicose veins of both lower extremities      Past Surgical History:  Procedure Laterality Date  . ENDOVENOUS ABLATION SAPHENOUS VEIN W/ LASER Right 05/14/2016   endovenous  laser ablation right greater saphenous vein by Tinnie Gens MD     Objective:   Vitals: BP 115/79 (BP Location: Left Arm)   Pulse 80   Temp (!) 97.5 F (36.4 C) (Oral)   Resp 17   LMP  (LMP Unknown)   SpO2 98%   Physical Exam  Constitutional: She is oriented to person, place, and time. She appears well-developed and well-nourished.  HENT:  Mouth/Throat: Oropharynx is clear and moist.  Eyes: Right eye exhibits no discharge. Left eye exhibits no discharge. No scleral icterus.  Neck: Normal range of motion. Neck supple.  Cardiovascular: Normal rate, regular rhythm and intact distal pulses. Exam reveals no gallop and no friction rub.  No murmur heard. Pulmonary/Chest: No respiratory distress. She has no wheezes. She has no rales.  Abdominal: Soft. Bowel sounds are normal. She exhibits no distension and no mass. There is no tenderness. There is no rebound and no guarding.  Neurological: She is alert and oriented to person, place, and time. No cranial nerve deficit.  Skin: Skin is warm and dry.  Psychiatric: She has a normal mood and affect.    Results for orders placed or performed during the hospital encounter of 06/10/17 (from the past 24 hour(s))  I-STAT, chem 8     Status: Abnormal   Collection Time: 06/10/17  6:04 PM  Result Value Ref Range   Sodium 140 135 - 145 mmol/L   Potassium 3.1 (L) 3.5 - 5.1 mmol/L   Chloride 101 101 - 111 mmol/L  BUN 8 6 - 20 mg/dL   Creatinine, Ser 0.50 0.44 - 1.00 mg/dL   Glucose, Bld 164 (H) 65 - 99 mg/dL   Calcium, Ion 1.10 (L) 1.15 - 1.40 mmol/L   TCO2 24 22 - 32 mmol/L   Hemoglobin 14.3 12.0 - 15.0 g/dL   HCT 42.0 36.0 - 46.0 %  POCT urinalysis dip (device)     Status: Abnormal   Collection Time: 06/10/17  6:06 PM  Result Value Ref Range   Glucose, UA NEGATIVE NEGATIVE mg/dL   Bilirubin Urine NEGATIVE NEGATIVE   Ketones, ur TRACE (A) NEGATIVE mg/dL   Specific Gravity, Urine 1.020 1.005 - 1.030   Hgb urine dipstick TRACE (A) NEGATIVE    pH 6.5 5.0 - 8.0   Protein, ur NEGATIVE NEGATIVE mg/dL   Urobilinogen, UA 0.2 0.0 - 1.0 mg/dL   Nitrite NEGATIVE NEGATIVE   Leukocytes, UA TRACE (A) NEGATIVE   Assessment and Plan :   Dizziness  Nausea without vomiting  Essential hypertension  Dehydration  Hypokalemia  Counseled patient on lifestyle modifications including adequate hydration and healthy diet to avoid dehydration and diabetes.  I offered patient supportive care including Zofran and meclizine.  Counseled that she needs follow-up with her PCP to have another check for diabetes including an A1c test.  ER precautions given to patient. Counseled patient on potential for adverse effects with medications prescribed today, patient verbalized understanding. Return-to-clinic precautions discussed, patient verbalized understanding.      Jaynee Eagles, PA-C 06/10/17 1924    Jaynee Eagles, PA-C 06/10/17 1924

## 2017-06-10 NOTE — ED Notes (Signed)
Patient discharged by provider.

## 2017-06-10 NOTE — ED Triage Notes (Signed)
Patient presents to [facility] for dizziness and nausea x3 days, pt also complains of blurred vision and headache when she feels nausea, pt has not taken any OTC

## 2017-06-12 LAB — URINE CULTURE

## 2017-06-13 ENCOUNTER — Emergency Department (HOSPITAL_COMMUNITY)
Admission: EM | Admit: 2017-06-13 | Discharge: 2017-06-14 | Disposition: A | Discharge status: Home / Self Care | Payer: Medicare Other | Attending: Emergency Medicine | Admitting: Emergency Medicine

## 2017-06-13 ENCOUNTER — Emergency Department (HOSPITAL_COMMUNITY): Payer: Medicare Other

## 2017-06-13 ENCOUNTER — Encounter (HOSPITAL_COMMUNITY): Payer: Self-pay

## 2017-06-13 ENCOUNTER — Other Ambulatory Visit: Payer: Self-pay

## 2017-06-13 DIAGNOSIS — Z79899 Other long term (current) drug therapy: Secondary | ICD-10-CM | POA: Diagnosis not present

## 2017-06-13 DIAGNOSIS — R42 Dizziness and giddiness: Secondary | ICD-10-CM | POA: Diagnosis not present

## 2017-06-13 DIAGNOSIS — R51 Headache: Secondary | ICD-10-CM | POA: Diagnosis not present

## 2017-06-13 DIAGNOSIS — R079 Chest pain, unspecified: Secondary | ICD-10-CM | POA: Diagnosis not present

## 2017-06-13 DIAGNOSIS — I10 Essential (primary) hypertension: Secondary | ICD-10-CM | POA: Insufficient documentation

## 2017-06-13 DIAGNOSIS — R110 Nausea: Secondary | ICD-10-CM | POA: Diagnosis not present

## 2017-06-13 DIAGNOSIS — R531 Weakness: Secondary | ICD-10-CM | POA: Diagnosis not present

## 2017-06-13 LAB — URINALYSIS, ROUTINE W REFLEX MICROSCOPIC
BACTERIA UA: NONE SEEN
BILIRUBIN URINE: NEGATIVE
Glucose, UA: NEGATIVE mg/dL
Ketones, ur: NEGATIVE mg/dL
Leukocytes, UA: NEGATIVE
NITRITE: NEGATIVE
PH: 8 (ref 5.0–8.0)
Protein, ur: NEGATIVE mg/dL
SPECIFIC GRAVITY, URINE: 1.006 (ref 1.005–1.030)

## 2017-06-13 LAB — BASIC METABOLIC PANEL
Anion gap: 10 (ref 5–15)
BUN: 5 mg/dL — AB (ref 6–20)
CALCIUM: 9.3 mg/dL (ref 8.9–10.3)
CO2: 27 mmol/L (ref 22–32)
Chloride: 100 mmol/L — ABNORMAL LOW (ref 101–111)
Creatinine, Ser: 0.5 mg/dL (ref 0.44–1.00)
GFR calc Af Amer: >60 mL/min (ref >60)
GFR calc non Af Amer: >60 mL/min (ref >60)
GLUCOSE: 106 mg/dL — AB (ref 65–99)
Potassium: 3.4 mmol/L — ABNORMAL LOW (ref 3.5–5.1)
Sodium: 137 mmol/L (ref 135–145)

## 2017-06-13 LAB — CBC
HEMATOCRIT: 41 % (ref 36.0–46.0)
Hemoglobin: 14 g/dL (ref 12.0–15.0)
MCH: 30 pg (ref 26.0–34.0)
MCHC: 34.1 g/dL (ref 30.0–36.0)
MCV: 88 fL (ref 78.0–100.0)
Platelets: 255 10*3/uL (ref 150–400)
RBC: 4.66 MIL/uL (ref 3.87–5.11)
RDW: 12.6 % (ref 11.5–15.5)
WBC: 6.1 10*3/uL (ref 4.0–10.5)

## 2017-06-13 LAB — I-STAT TROPONIN, ED: Troponin i, poc: 0 ng/mL (ref 0.00–0.08)

## 2017-06-13 NOTE — ED Notes (Signed)
ED Provider at bedside. 

## 2017-06-13 NOTE — ED Triage Notes (Signed)
Patient complains of 5 days of general weakness with dizziness. Seen at [facility] Friday and given meds for dizziness and nausea. Alert and oriented, complains of general headache. No neuro deficits. Was told Friday her BS was high and no hx of diabetes

## 2017-06-13 NOTE — ED Notes (Signed)
Patient transported to MRI 

## 2017-06-13 NOTE — ED Notes (Addendum)
Pt back from MRI; upon entering room for assessment; patient was eating food provided by family. Patient presented with no difficulty swallowing, chewing, and patient given a drink of water with no problem.

## 2017-06-13 NOTE — ED Notes (Signed)
Pt discharged from ED; instructions provided; Pt encouraged to return to ED if symptoms worsen and to f/u with PCP; Pt verbalized understanding of all instructions 

## 2017-06-13 NOTE — ED Provider Notes (Signed)
I saw and evaluated the patient, reviewed the resident's note and I agree with the findings and plan.  Pertinent History: the pt has had dizzyness, and now has some CP and L arm pain - saw UC recently and was given meclizine - no improvemne t- sx are constant.    Pertinent Exam findings: Has normal neuro exam - normal gait unassisted, no other acute findings.   EKG Interpretation  Date/Time:  Monday Jun 13 2017 10:11:19 EDT Ventricular Rate:  77 PR Interval:  194 QRS Duration: 130 QT Interval:  390 QTC Calculation: 441 R Axis:   -54 Text Interpretation:  Normal sinus rhythm Right bundle branch block Left anterior fascicular block  Bifascicular block  Abnormal ECG Since last tracing in 2004, there is now a RBBB, otherwise no other signficant findings Confirmed by Noemi Chapel [phone]) on 06/13/2017 4:46:01 PM        I personally interpreted the EKG as well as the resident and agree with the interpretation on the resident's chart.  Final diagnoses:  Dizziness      Noemi Chapel, MD 06/15/17 [phone]

## 2017-06-13 NOTE — ED Provider Notes (Signed)
[facility] Provider Note   CSN: [id number] Arrival date & time: 06/13/17  1006     History   Chief Complaint No chief complaint on file.   HPI Shelly Meyer is a 67 y.o. female.  The history is provided by the patient.  Dizziness  Quality:  Room spinning and imbalance Severity:  Moderate Onset quality:  Gradual Duration:  1 week Timing:  Constant Progression:  Unchanged Chronicity:  New Relieved by:  Nothing Worsened by:  Movement and standing up Ineffective treatments: Meclizine, Zofran. Associated symptoms: chest pain, headaches and nausea   Associated symptoms: no palpitations, no shortness of breath and no vomiting   Chest pain:    Quality: aching     Severity:  Mild   Onset quality:  Gradual   Duration:  3 days   Timing:  Constant   Progression:  Unchanged   Chronicity:  New   Past Medical History:  Diagnosis Date  . Carpal tunnel syndrome 02/28/2015  . Depression 02/28/2015  . Hypertension   . Osteoarthritis   . Spinal stenosis of lumbar region 02/28/2015  . Varicose veins of both lower extremities     Patient Active Problem List   Diagnosis Date Noted  . Rotator cuff tendinitis, left 04/19/2017  . Dyspepsia 02/14/2017  . Acute pain of left shoulder 02/14/2017  . Gait disturbance 02/14/2017  . Osteoarthritis 10/29/2015  . Spinal stenosis of lumbar region 02/28/2015  . Carpal tunnel syndrome 02/28/2015  . Varicose veins of bilateral lower extremities with other complications 40/98/1191  . Achilles tendinitis of left lower extremity 02/28/2015  . Depression 02/28/2015    Past Surgical History:  Procedure Laterality Date  . ENDOVENOUS ABLATION SAPHENOUS VEIN W/ LASER Right 05/14/2016   endovenous laser ablation right greater saphenous vein by Tinnie Gens MD      OB History   None      Home Medications    Prior to Admission medications   Medication Sig Start Date End Date Taking? Authorizing Provider   acetaminophen (TYLENOL) 500 MG tablet Take 1 tablet (500 mg total) by mouth every 6 (six) hours as needed for moderate pain. 03/18/17  Yes Ladell Pier, MD  lisinopril-hydrochlorothiazide (PRINZIDE,ZESTORETIC) 20-25 MG tablet Take 1 tablet by mouth daily. 05/26/17  Yes Ladell Pier, MD  meclizine (ANTIVERT) 25 MG tablet Take 1 tablet (25 mg total) by mouth 3 (three) times daily as needed for dizziness. 06/10/17  Yes Jaynee Eagles, PA-C  ondansetron (ZOFRAN-ODT) 4 MG disintegrating tablet Take 1-2 tablets (4-8 mg total) by mouth every 8 (eight) hours as needed for nausea. 06/10/17  Yes Wynona Luna, MD  diclofenac sodium (VOLTAREN) 1 % GEL Apply 2 g topically 4 (four) times daily. Patient not taking: Reported on 06/13/2017 02/14/17   Ladell Pier, MD  omeprazole (PRILOSEC) 20 MG capsule Take 1 capsule (20 mg total) by mouth daily. Patient not taking: Reported on 06/13/2017 02/14/17   Ladell Pier, MD  topiramate (TOPAMAX) 25 MG tablet Take 1 tablet (25 mg total) by mouth at bedtime. Patient not taking: Reported on 06/13/2017 04/19/17   Ladell Pier, MD    Family History Family History  Problem Relation Age of Onset  . Stroke Mother     Social History Social History   Tobacco Use  . Smoking status: Never Smoker  . Smokeless tobacco: Never Used  Substance Use Topics  . Alcohol use: No  . Drug use: No  Allergies   Patient has no known allergies.   Review of Systems Review of Systems  Constitutional: Negative for chills and fever.  HENT: Negative for ear pain and sore throat.   Eyes: Negative for pain and visual disturbance.  Respiratory: Negative for cough and shortness of breath.   Cardiovascular: Positive for chest pain. Negative for palpitations.  Gastrointestinal: Positive for nausea. Negative for abdominal pain and vomiting.  Genitourinary: Negative for dysuria and hematuria.  Musculoskeletal: Positive for arthralgias. Negative for back pain.  Skin:  Negative for color change and rash.  Neurological: Positive for dizziness and headaches. Negative for seizures and syncope.  All other systems reviewed and are negative.    Physical Exam Updated Vital Signs BP 120/66   Pulse 69   Temp 97.8 F (36.6 C) (Oral)   Resp 13   LMP  (LMP Unknown)   SpO2 94%   Physical Exam  Constitutional: She is oriented to person, place, and time. She appears well-developed and well-nourished. No distress.  HENT:  Head: Normocephalic and atraumatic.  Eyes: Conjunctivae are normal.  Neck: Neck supple.  Cardiovascular: Normal rate and regular rhythm.  No murmur heard. Pulmonary/Chest: Effort normal and breath sounds normal. No respiratory distress.  Abdominal: Soft. There is no tenderness.  Musculoskeletal: She exhibits no edema.  Neurological: She is alert and oriented to person, place, and time. She displays normal reflexes. No cranial nerve deficit or sensory deficit. She exhibits normal muscle tone. Coordination normal.  Equal strength in all extremities. Sensation to light touch intact throughout. Normal cerebellar testing. Walks with normal gait, no ataxia.  Skin: Skin is warm and dry.  Psychiatric: She has a normal mood and affect.  Nursing note and vitals reviewed.    ED Treatments / Results  Labs (all labs ordered are listed, but only abnormal results are displayed) Labs Reviewed  BASIC METABOLIC PANEL - Abnormal; Notable for the following components:      Result Value   Potassium 3.4 (*)    Chloride 100 (*)    Glucose, Bld 106 (*)    BUN 5 (*)    All other components within normal limits  URINALYSIS, ROUTINE W REFLEX MICROSCOPIC - Abnormal; Notable for the following components:   Color, Urine STRAW (*)    Hgb urine dipstick SMALL (*)    All other components within normal limits  CBC  I-STAT TROPONIN, ED    EKG EKG Interpretation  Date/Time:  Monday Jun 13 2017 10:11:19 EDT Ventricular Rate:  77 PR Interval:  194 QRS  Duration: 130 QT Interval:  390 QTC Calculation: 441 R Axis:   -54 Text Interpretation:  Normal sinus rhythm Right bundle branch block Left anterior fascicular block  Bifascicular block  Abnormal ECG Since last tracing in 2004, there is now a RBBB, otherwise no other signficant findings Confirmed by Noemi Chapel [phone]) on 06/13/2017 4:46:01 PM    Radiology Dg Chest 2 View  Result Date: 06/13/2017 CLINICAL DATA:  Weakness and dizziness over the last 5 days, headache EXAM: CHEST - 2 VIEW COMPARISON:  Chest x-ray of 03/10/2017 FINDINGS: The lungs are not well aerated with mild basilar volume loss. No pneumonia or effusion is seen. Mediastinal and hilar contours are unremarkable and the heart is within upper limits of normal. No acute bony abnormality is seen. IMPRESSION: Poor inspiration with mild basilar volume loss. No definite active process. Electronically Signed   By: Ivar Drape M.D.   On: 06/13/2017 16:58   Ct Head Wo Contrast  Result Date: 06/13/2017 CLINICAL DATA:  Patient complains of 5 days of general weakness with dizziness. Seen at [facility] Friday and given meds for dizziness and nausea. Alert and oriented, complains of general headache. No neuro deficits EXAM: CT HEAD WITHOUT CONTRAST TECHNIQUE: Contiguous axial images were obtained from the base of the skull through the vertex without intravenous contrast. COMPARISON:  02/17/2017 FINDINGS: Brain: No evidence of acute infarction, hemorrhage, hydrocephalus, extra-axial collection or mass lesion/mass effect. Vascular: No hyperdense vessel or unexpected calcification. Skull: Normal. Negative for fracture or focal lesion. Sinuses/Orbits: Globes and orbits are unremarkable. The visualized sinuses and mastoid air cells are clear. Other: None. IMPRESSION: Normal unenhanced CT scan of the brain. Electronically Signed   By: Lajean Manes M.D.   On: 06/13/2017 16:57   Mr Brain Wo Contrast  Result Date: 06/13/2017 CLINICAL DATA:  Initial evaluation for  persistent vertigo, dizziness. EXAM: MRI HEAD WITHOUT CONTRAST TECHNIQUE: Multiplanar, multiecho pulse sequences of the brain and surrounding structures were obtained without intravenous contrast. COMPARISON:  Prior CT from earlier the same day. FINDINGS: Brain: Cerebral volume within normal limits for age. No focal parenchymal signal abnormality identified. No significant cerebral white matter changes for age. No abnormal foci of restricted diffusion to suggest acute or subacute ischemia. Gray-white matter differentiation maintained. No areas of remote or chronic infarction. No acute or chronic intracranial hemorrhage. No mass lesion, midline shift or mass effect. No hydrocephalus. No extra-axial fluid collection. Major dural sinuses are grossly patent. Incidental note made of an empty sella. Midline structures intact and normal. Vascular: Major intracranial vascular flow voids are maintained at the skull base. Skull and upper cervical spine: Craniocervical junction within normal limits. Mild degenerative spondylolysis noted at C4-5 without significant stenosis. Remainder the visualized upper cervical spine unremarkable. Bone marrow signal intensity within normal limits. No scalp soft tissue abnormality. Sinuses/Orbits: Globes and orbital soft tissues grossly unremarkable. Patient status post lens extraction on the left. Paranasal sinuses are grossly clear. No mastoid effusion. Inner ear structures normal. Other: Probable small intraparotid lymph nodes noted bilaterally. IMPRESSION: 1. No acute intracranial abnormality identified. 2. Empty sella. While this finding is often incidental in nature and of no clinical significance, this can also be seen in the setting of elevated intracranial pressures. 3. Otherwise unremarkable and normal brain MRI.  We Electronically Signed   By: Jeannine Boga M.D.   On: 06/13/2017 19:52    Procedures Procedures (including critical care time)  Medications Ordered in  ED Medications - No data to display   Initial Impression / Assessment and Plan / ED Course  I have reviewed the triage vital signs and the nursing notes.  Pertinent labs & imaging results that were available during my care of the patient were reviewed by me and considered in my medical decision making (see chart for details).     Pt is a 67 yo female with hx of hypertension who p/w dizziness, chest pain, and headaches. Her initial and primary complaint is dizziness. She reports "room spinning" since 1 week ago. She saw urgent care on Friday and was started on Meclizine and Zofran. She has taken these medications with no relief. She denies any vomiting. She endorses vague, aching chest pain since Friday. Also c/o L shoulder pain which radiates down left arm. Her CP is new but she has had similar L shoulder pain previously which improved with steroid injection. She has not had any SOB. Her CP is atypical and non-exertional. No personal hx of CAD but does  have positive family history. EKG shows no acute ischemic changes. New RBBB from her last EKG which was over 10 years ago. Labs obtained show mild hypokalemia but otherwise reassuring. CT scan pending. I will add CXR and troponin to rule out ACS. Her neurological testing is normal and I do not have high suspicion at this time for posterior circulation stroke.  Troponin is negative.  Patient is no longer having chest pain.  CT head was unremarkable.  Given her persistent dizziness for 1 week, we will obtain MRI.  Her neurologic exam remains normal.  MRI was obtained and shows no acute findings.  There was evidence of an empty sella.  Visual acuity testing was performed.  Her vision is mildly diminished bilaterally.  Patient will likely benefit from follow-up with neurology given the persistent dizziness.  She has failed to improve with meclizine and Zofran.  Of note, patient is able to ambulate here without difficulty.  Given the MRI and CT findings, I  feel that she is stable for discharge at this time.  She does not appear to have an emergent medical condition.  Recommended PCP and neurology follow-up.  I recommended she stop the meclizine since it does not seem to be helping.  Return precautions discussed in detail.  Patient discharged in stable condition.  Final Clinical Impressions(s) / ED Diagnoses   Final diagnoses:  Dizziness    ED Discharge Orders    None       Clifton James, MD 06/13/17 2311    Noemi Chapel, MD 06/15/17 [phone]

## 2017-06-14 ENCOUNTER — Other Ambulatory Visit: Payer: Self-pay

## 2017-06-14 DIAGNOSIS — I10 Essential (primary) hypertension: Secondary | ICD-10-CM

## 2017-06-14 MED ORDER — LISINOPRIL-HYDROCHLOROTHIAZIDE 20-25 MG PO TABS: 1.0000 | ORAL_TABLET | Freq: Every day | ORAL | 0 refills | Status: DC

## 2017-06-15 ENCOUNTER — Encounter: Payer: Self-pay | Admitting: Neurology

## 2017-06-15 ENCOUNTER — Ambulatory Visit (INDEPENDENT_AMBULATORY_CARE_PROVIDER_SITE_OTHER): Payer: Medicare Other | Admitting: Neurology

## 2017-06-15 VITALS — BP 115/77 | HR 77 | Wt 190.0 lb

## 2017-06-15 DIAGNOSIS — R0683 Snoring: Secondary | ICD-10-CM

## 2017-06-15 DIAGNOSIS — R42 Dizziness and giddiness: Secondary | ICD-10-CM | POA: Diagnosis not present

## 2017-06-15 DIAGNOSIS — R519 Headache, unspecified: Secondary | ICD-10-CM

## 2017-06-15 DIAGNOSIS — E669 Obesity, unspecified: Secondary | ICD-10-CM

## 2017-06-15 DIAGNOSIS — R351 Nocturia: Secondary | ICD-10-CM

## 2017-06-15 DIAGNOSIS — G4719 Other hypersomnia: Secondary | ICD-10-CM

## 2017-06-15 DIAGNOSIS — R51 Headache: Secondary | ICD-10-CM | POA: Diagnosis not present

## 2017-06-15 NOTE — Patient Instructions (Addendum)
Please follow up with your eye doctor, I do believe, this is important.  Continue or start the topamax you were given by Dr. Wynetta Emery.  I would recommend we do a sleep study for your sleep difficulty, snoring and morning headaches.   I think we should proceed with a sleep study to determine whether you do or do not have OSA and how severe it is. If you have more than mild OSA, I want you to consider treatment with CPAP. Please remember, the risks and ramifications of moderate to severe obstructive sleep apnea or OSA are: Cardiovascular disease, including congestive heart failure, stroke, difficult to control hypertension, arrhythmias, and even type 2 diabetes has been linked to untreated OSA. Sleep apnea causes disruption of sleep and sleep deprivation in most cases, which, in turn, can cause recurrent headaches, problems with memory, mood, concentration, focus, and vigilance. Most people with untreated sleep apnea report excessive daytime sleepiness, which can affect their ability to drive. Please do not drive if you feel sleepy.   I will likely see you back after your sleep study to go over the test results and where to go from there. We will call you after your sleep study to advise about the results (most likely, you will hear from [place], my nurse) and to set up an appointment at the time, as necessary.    Our sleep lab administrative assistant will call you to schedule your sleep study. If you don't hear back from her by about 2 weeks from now, please feel free to call her at [phone]. You can leave a message with your phone number and concerns, if you get the voicemail box. She will call back as soon as possible.

## 2017-06-15 NOTE — Progress Notes (Signed)
Subjective:    Patient ID: Shelly Meyer is a 67 y.o. female.  HPI     Shelly Age, MD, PhD [facility] [address], [address] [place], [place] [address]  I saw Shelly Meyer as a referral from the emergency room for dizziness. The patient is accompanied by her daughter and an interpreter today. She is a 67 year old right-handed woman with an underlying medical history of carpal tunnel syndrome, hypertension, osteoarthritis, depression, lumbar spinal stenosis and obesity, who reports intermittent dizziness and a recent spinning sensation which prompted her to go to the emergency room. Per daughter, she was told that she was dehydrated and that she should increase her water intake, her potassium was low as well. She had a brain MRI without contrast on 06/13/2017 and I reviewed the results: IMPRESSION: 1. No acute intracranial abnormality identified. 2. Empty sella. While this finding is often incidental in nature and of no clinical significance, this can also be seen in the setting of elevated intracranial pressures. 3. Otherwise unremarkable and normal brain MRI. 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 She did not pursue her right cataract surgery because she is scared that she will have problems with the right eye. She forgot to bring her glasses today. She reports that her dizziness with the sensation of room spinning subsided in the interim. She does not sleep well. She often wakes up with a headache. She has had recurrent headaches for the past 3-4 years, since she was in [place]. She was given a prescription for low-dose Topamax 25 mg at bedtime in March but it is unclear if she ever took it. She did not have the bottle with her today along with the other bottle she brought. She is not sure if she ever took it or stop taking it. Her daughter does not know  either. She has a follow-up coming up with her primary care physician soon. She does not sleep but a few hours at night, she has nocturia about once per average night, she sometimes wakes up with a headache and she reports snoring. She does not smoke and does not utilize alcohol, she does not drink caffeine daily.  Her Past Medical History Is Significant For: Past Medical History:  Diagnosis Date  . Carpal tunnel syndrome 02/28/2015  . Depression 02/28/2015  . Hypertension   . Osteoarthritis   . Spinal stenosis of lumbar region 02/28/2015  . Varicose veins of both lower extremities     Her Past Surgical History Is Significant For: Past Surgical History:  Procedure Laterality Date  . ENDOVENOUS ABLATION SAPHENOUS VEIN W/ LASER Right 05/14/2016   endovenous laser ablation right greater saphenous vein by Tinnie Gens MD     Her Family History Is Significant For: Family History  Problem Relation Meyer of Onset  . Stroke Mother     Her Social History Is Significant For: Social History   Socioeconomic History  . Marital status: Divorced    Spouse name: Not on file  . Number of children: Not on file  . Years of education: Not on file  . Highest education level: Not on file  Occupational History  . Not on file  Social Needs  . Financial resource strain: Not on file  . Food insecurity:    Worry: Not on file    Inability: Not on file  . Transportation needs:  Medical: Not on file    Non-medical: Not on file  Tobacco Use  . Smoking status: Never Smoker  . Smokeless tobacco: Never Used  Substance and Sexual Activity  . Alcohol use: No  . Drug use: No  . Sexual activity: Not on file  Lifestyle  . Physical activity:    Days per week: Not on file    Minutes per session: Not on file  . Stress: Not on file  Relationships  . Social connections:    Talks on phone: Not on file    Gets together: Not on file    Attends religious service: Not on file    Active member of club or  organization: Not on file    Attends meetings of clubs or organizations: Not on file    Relationship status: Not on file  Other Topics Concern  . Not on file  Social History Narrative  . Not on file    Her Allergies Are:  No Known Allergies:   Her Current Medications Are:  Outpatient Encounter Medications as of 06/15/2017  Medication Sig  . lisinopril-hydrochlorothiazide (PRINZIDE,ZESTORETIC) 20-25 MG tablet Take 1 tablet by mouth daily.  . [DISCONTINUED] acetaminophen (TYLENOL) 500 MG tablet Take 1 tablet (500 mg total) by mouth every 6 (six) hours as needed for moderate pain.  . [DISCONTINUED] diclofenac sodium (VOLTAREN) 1 % GEL Apply 2 g topically 4 (four) times daily.  . [DISCONTINUED] meclizine (ANTIVERT) 25 MG tablet Take 1 tablet (25 mg total) by mouth 3 (three) times daily as needed for dizziness.  . [DISCONTINUED] omeprazole (PRILOSEC) 20 MG capsule Take 1 capsule (20 mg total) by mouth daily.  . [DISCONTINUED] ondansetron (ZOFRAN-ODT) 4 MG disintegrating tablet Take 1-2 tablets (4-8 mg total) by mouth every 8 (eight) hours as needed for nausea.  . [DISCONTINUED] topiramate (TOPAMAX) 25 MG tablet Take 1 tablet (25 mg total) by mouth at bedtime.   No facility-administered encounter medications on file as of 06/15/2017.   : Review of Systems:  Out of a complete 14 point review of systems, all are reviewed and negative with the exception of these symptoms as listed below: Review of Systems  Neurological:       Patient felt a little dizzy going from a laying to sitting position.   Orthostatics:  Laying - 115/77, 77 Sitting - 122/73, 79 Standing - 109/72, 87 Standing+ - 122/78, 86    Objective:  Neurological Exam  Physical Exam Physical Examination:   Vitals:   06/15/17 0917  BP: 115/77  Pulse: 77   General Examination: The patient is a very pleasant 66 y.o. female in no acute distress. She appears well-developed and well-nourished and well groomed.   She had an  initial 13 point systolic blood pressure drop upon standing but this corrected within 2 minutes. She reported some lightheadedness when she first sat up after lying down. She did not report any vertiginous symptoms or nausea.  HEENT: Normocephalic, atraumatic, pupils are equal, round and reactive to light and accommodation. Funduscopic exam is difficult bilaterally. She is status post cataract surgery on the left and has a small horizontal corneal lesion in the left eye, right eye fairly dense cataract. Extraocular tracking is good, she did not bring her eye glasses. Face is symmetric, hearing grossly intact, neck supple, airway examination reveals moderate airway crowding due to redundant soft palate, tonsils are 1+ bilaterally. Mallampati is class II. Neck circumference is 15-1/2 inches. She has a partial plate on top, very few teeth on  the bottom.  Tongue protrudes centrally and palate elevates symmetrically, speech is clear, no dysarthria.  Chest: Clear to auscultation without wheezing, rhonchi or crackles noted.  Heart: S1+S2+0, regular and normal without murmurs, rubs or gallops noted.   Abdomen: Soft, non-tender and non-distended with normal bowel sounds appreciated on auscultation.  Extremities: There is no pitting edema in the distal lower extremities bilaterally.   Skin: Warm and dry without trophic changes noted. She has prominent varicose veins, right more than left.  Musculoskeletal: exam reveals no obvious joint deformities, tenderness or joint swelling or erythema.   Neurologically:  Mental status: The patient is awake, alert and oriented in all 4 spheres. Her immediate and remote memory, attention, language skills and fund of knowledge are appropriate. There is no evidence of aphasia, agnosia, apraxia or anomia. Speech is clear with normal prosody and enunciation. Thought process is linear. Mood is normal and affect is normal.  Cranial nerves II - XII are as described above under  HEENT exam. In addition: shoulder shrug is normal with equal shoulder height noted. Motor exam: Normal bulk, strength and tone is noted. There is no drift, tremor or rebound. Romberg is negative. Reflexes are 1+. Fine motor skills and coordination: intact grossly.  Cerebellar testing: No dysmetria or intention tremor on finger to nose testing. There is no truncal or gait ataxia.  Sensory exam: intact to light touch in the upper and lower extremities.  Gait, station and balance: She stands easily. No veering to one side is noted. No leaning to one side is noted. Posture is Meyer-appropriate and stance is narrow based. Gait shows normal stride length and normal pace. No problems turning are noted.  Assessment and Plan:  In summary, Shelly Meyer is a very pleasant 67 y.o.-year old female with an underlying medical history of carpal tunnel syndrome, hypertension, osteoarthritis, depression, lumbar spinal stenosis and obesity, who Presents for consultation of her recent dizziness. She presented to the emergency room 2 days ago, she had a brain MRI without contrast I reviewed the results with them. She was explained that empty sella is not a lesion, and most typically an incidental finding. Nevertheless, I think it is important that she get her eye exam done and follow-up with her eye doctor. She did not go back but is encouraged to make appointment. Furthermore, she reports headaches and poor sleep. She is advised to start Topamax as previously prescribed by her primary care provider. We will proceed with a sleep study to rule out sleep apnea, if she has sleep apnea she would benefit from treatment she may feel better, stay less sleepy during the day as she does indicate dozing off during the day multiple times. She sometimes wakes up with a headache. She may improve in that regard. She reports snoring and given her airway exam and her obesity, she may have underlying OSA. I explained my findings to the patient and  her daughter. We will see her back after sleep study testing is completed, she has an appointment with her primary care physician in the interim coming up soon. Neurological exam is nonfocal, she did not have any vertigo or any significant orthostatic hypotension. She is encouraged to stay well-hydrated. I answered all their questions today and the patient and her daughter were in agreement. Shelly Age, MD, PhD

## 2017-06-17 ENCOUNTER — Ambulatory Visit: Payer: Medicare Other | Admitting: Internal Medicine

## 2017-06-21 ENCOUNTER — Ambulatory Visit: Payer: Medicare Other | Attending: Internal Medicine | Admitting: Internal Medicine

## 2017-06-21 ENCOUNTER — Encounter: Payer: Self-pay | Admitting: Internal Medicine

## 2017-06-21 VITALS — BP 117/86 | HR 85 | Temp 97.7°F | Resp 16 | Wt 189.6 lb

## 2017-06-21 DIAGNOSIS — G43909 Migraine, unspecified, not intractable, without status migrainosus: Secondary | ICD-10-CM | POA: Insufficient documentation

## 2017-06-21 DIAGNOSIS — M48061 Spinal stenosis, lumbar region without neurogenic claudication: Secondary | ICD-10-CM | POA: Diagnosis not present

## 2017-06-21 DIAGNOSIS — Z1231 Encounter for screening mammogram for malignant neoplasm of breast: Secondary | ICD-10-CM

## 2017-06-21 DIAGNOSIS — M1990 Unspecified osteoarthritis, unspecified site: Secondary | ICD-10-CM | POA: Diagnosis not present

## 2017-06-21 DIAGNOSIS — R269 Unspecified abnormalities of gait and mobility: Secondary | ICD-10-CM | POA: Insufficient documentation

## 2017-06-21 DIAGNOSIS — R42 Dizziness and giddiness: Secondary | ICD-10-CM | POA: Diagnosis not present

## 2017-06-21 DIAGNOSIS — Z79899 Other long term (current) drug therapy: Secondary | ICD-10-CM | POA: Diagnosis not present

## 2017-06-21 DIAGNOSIS — G5600 Carpal tunnel syndrome, unspecified upper limb: Secondary | ICD-10-CM | POA: Diagnosis not present

## 2017-06-21 DIAGNOSIS — Z1239 Encounter for other screening for malignant neoplasm of breast: Secondary | ICD-10-CM

## 2017-06-21 DIAGNOSIS — F329 Major depressive disorder, single episode, unspecified: Secondary | ICD-10-CM | POA: Diagnosis not present

## 2017-06-21 DIAGNOSIS — I10 Essential (primary) hypertension: Secondary | ICD-10-CM

## 2017-06-21 MED ORDER — HYDROCHLOROTHIAZIDE 12.5 MG PO TABS: 12.5000 mg | ORAL_TABLET | Freq: Every day | ORAL | 1 refills | Status: DC

## 2017-06-21 MED ORDER — LISINOPRIL 20 MG PO TABS: 20.0000 mg | ORAL_TABLET | Freq: Every day | ORAL | 3 refills | Status: DC

## 2017-06-21 NOTE — Progress Notes (Signed)
Patient ID: Shelly Meyer, female    DOB: 17-Aug-1950  MRN: [id number]  CC: Hypertension   Subjective: Shelly Meyer is a 67 y.o. female who presents for chronic ds management.  Her daughter,Shelly Meyer, is with her and interprets. Her concerns today include:  Pt with hx of CTS, depression, HTN, OA, spinal stenosis, varicose veins s/p laser ablation RT great saph vein 04/2016, migraine  Seen in ER recently for dizziness.  Had negative CT of the head.  MRI revealed empty sella turcica.  She has since seen the neurologist who has ordered a sleep study.  She reports that the dizziness has resolved.  However she does notice some dizziness when she takes her blood pressure medicine which is lisinopril/HCTZ.  She also reports feeling drugged and confused whenever she takes Topamax.  This was prescribed on last visit for migraine prophylaxis.  HM: Reportedly had c-scope 4 yrs ago in [place].  She is due for mammogram Patient Active Problem List   Diagnosis Date Noted  . Rotator cuff tendinitis, left 04/19/2017  . Dyspepsia 02/14/2017  . Acute pain of left shoulder 02/14/2017  . Gait disturbance 02/14/2017  . Osteoarthritis 10/29/2015  . Spinal stenosis of lumbar region 02/28/2015  . Carpal tunnel syndrome 02/28/2015  . Varicose veins of bilateral lower extremities with other complications 35/36/1443  . Achilles tendinitis of left lower extremity 02/28/2015  . Depression 02/28/2015     No current outpatient medications on file prior to visit.   No current facility-administered medications on file prior to visit.     No Known Allergies  Social History   Socioeconomic History  . Marital status: Divorced    Spouse name: Not on file  . Number of children: Not on file  . Years of education: Not on file  . Highest education level: Not on file  Occupational History  . Not on file  Social Needs  . Financial resource strain: Not on file  . Food insecurity:    Worry: Not on file    Inability: Not  on file  . Transportation needs:    Medical: Not on file    Non-medical: Not on file  Tobacco Use  . Smoking status: Never Smoker  . Smokeless tobacco: Never Used  Substance and Sexual Activity  . Alcohol use: No  . Drug use: No  . Sexual activity: Not on file  Lifestyle  . Physical activity:    Days per week: Not on file    Minutes per session: Not on file  . Stress: Not on file  Relationships  . Social connections:    Talks on phone: Not on file    Gets together: Not on file    Attends religious service: Not on file    Active member of club or organization: Not on file    Attends meetings of clubs or organizations: Not on file    Relationship status: Not on file  . Intimate partner violence:    Fear of current or ex partner: Not on file    Emotionally abused: Not on file    Physically abused: Not on file    Forced sexual activity: Not on file  Other Topics Concern  . Not on file  Social History Narrative  . Not on file    Family History  Problem Relation Age of Onset  . Stroke Mother     Past Surgical History:  Procedure Laterality Date  . ENDOVENOUS ABLATION SAPHENOUS VEIN W/ LASER Right 05/14/2016  endovenous laser ablation right greater saphenous vein by Tinnie Gens MD     ROS: Review of Systems Negative except as stated above PHYSICAL EXAM: BP 117/86   Pulse 85   Temp 97.7 F (36.5 C) (Oral)   Resp 16   Wt 189 lb 9.6 oz (86 kg)   LMP  (LMP Unknown)   SpO2 95%   BMI 34.68 kg/m   Physical Exam  General appearance - alert, well appearing, and in no distress Mental status - normal mood, behavior, speech, dress, motor activity, and thought processes Chest - clear to auscultation, no wheezes, rales or rhonchi, symmetric air entry Heart - normal rate, regular rhythm, normal S1, S2, no murmurs, rubs, clicks or gallops    Chemistry      Component Value Date/Time   NA 137 06/13/2017 1032   NA 144 02/14/2017 1638   K 3.4 (L) 06/13/2017 1032   CL  100 (L) 06/13/2017 1032   CO2 27 06/13/2017 1032   BUN 5 (L) 06/13/2017 1032   BUN 10 02/14/2017 1638   CREATININE 0.50 06/13/2017 1032   CREATININE 0.44 (L) 02/28/2015 0955      Component Value Date/Time   CALCIUM 9.3 06/13/2017 1032   ALKPHOS 79 02/14/2017 1638   AST 22 02/14/2017 1638   ALT 27 02/14/2017 1638   BILITOT 0.3 02/14/2017 1638     Lab Results  Component Value Date   WBC 6.1 06/13/2017   HGB 14.0 06/13/2017   HCT 41.0 06/13/2017   MCV 88.0 06/13/2017   PLT 255 06/13/2017     ASSESSMENT AND PLAN: 1. Dizziness 2. Essential hypertension We will have her decrease the dose of current blood pressure medication.  It was changed to lisinopril 20 mg and HCTZ 12.5 mg Patient advised to stop Topamax - lisinopril (PRINIVIL,ZESTRIL) 20 MG tablet; Take 1 tablet (20 mg total) by mouth daily.  Dispense: 90 tablet; Refill: 3 - hydrochlorothiazide (HYDRODIURIL) 12.5 MG tablet; Take 1 tablet (12.5 mg total) by mouth daily.  Dispense: 90 tablet; Refill: 1  3. Breast cancer screening - MM Digital Screening; Future   Patient was given the opportunity to ask questions.  Patient verbalized understanding of the plan and was able to repeat key elements of the plan.   Orders Placed This Encounter  Procedures  . MM Digital Screening     Requested Prescriptions   Signed Prescriptions Disp Refills  . lisinopril (PRINIVIL,ZESTRIL) 20 MG tablet 90 tablet 3    Sig: Take 1 tablet (20 mg total) by mouth daily.  . hydrochlorothiazide (HYDRODIURIL) 12.5 MG tablet 90 tablet 1    Sig: Take 1 tablet (12.5 mg total) by mouth daily.    No follow-ups on file.  Karle Plumber, MD, FACP

## 2017-06-21 NOTE — Patient Instructions (Signed)
Stop Topamax. Stop Lisinopril/Hydrochlorothiazine 20/25 mg. Start Lisinopril 20 mg and HCTZ 12.5 mg daily.

## 2017-07-06 ENCOUNTER — Telehealth: Payer: Self-pay

## 2017-07-06 NOTE — Telephone Encounter (Signed)
We have attempted to call the patient two times to schedule sleep study.  Patient has been unavailable at the phone numbers we have on file and has not returned our calls.  At this point we will send a letter asking patient to please contact the sleep lab to schedule their sleep study.  If patient calls back we will schedule them for their sleep study. 

## 2017-09-22 ENCOUNTER — Other Ambulatory Visit: Payer: Self-pay | Admitting: Internal Medicine

## 2017-09-22 DIAGNOSIS — I10 Essential (primary) hypertension: Secondary | ICD-10-CM

## 2017-12-08 DIAGNOSIS — D1801 Hemangioma of skin and subcutaneous tissue: Secondary | ICD-10-CM | POA: Diagnosis not present

## 2017-12-08 DIAGNOSIS — L821 Other seborrheic keratosis: Secondary | ICD-10-CM | POA: Diagnosis not present

## 2017-12-08 DIAGNOSIS — D2262 Melanocytic nevi of left upper limb, including shoulder: Secondary | ICD-10-CM | POA: Diagnosis not present

## 2017-12-08 DIAGNOSIS — D2261 Melanocytic nevi of right upper limb, including shoulder: Secondary | ICD-10-CM | POA: Diagnosis not present

## 2017-12-08 DIAGNOSIS — L918 Other hypertrophic disorders of the skin: Secondary | ICD-10-CM | POA: Diagnosis not present

## 2017-12-08 DIAGNOSIS — L720 Epidermal cyst: Secondary | ICD-10-CM | POA: Diagnosis not present

## 2017-12-08 DIAGNOSIS — D225 Melanocytic nevi of trunk: Secondary | ICD-10-CM | POA: Diagnosis not present

## 2018-04-26 ENCOUNTER — Telehealth: Payer: Self-pay | Admitting: Internal Medicine

## 2018-04-26 DIAGNOSIS — I10 Essential (primary) hypertension: Secondary | ICD-10-CM

## 2018-04-26 NOTE — Telephone Encounter (Signed)
Patient's daughter called asking for med refill on her mothers  bp meds.  patient hasn't been see in 10 months and was supposed to come back in three. I informed the patient's daughter that she needs to bee seen by the doctor and the daughter told me if something happens to her mother it will be my fault. Patient has an appointment on Friday the 20th of march.

## 2018-04-27 ENCOUNTER — Other Ambulatory Visit: Payer: Self-pay | Admitting: Internal Medicine

## 2018-04-27 DIAGNOSIS — I10 Essential (primary) hypertension: Secondary | ICD-10-CM

## 2018-04-27 MED ORDER — LISINOPRIL 20 MG PO TABS: 20.0000 mg | ORAL_TABLET | Freq: Every day | ORAL | 0 refills | Status: DC

## 2018-04-27 MED ORDER — HYDROCHLOROTHIAZIDE 12.5 MG PO TABS: 12.5000 mg | ORAL_TABLET | Freq: Every day | ORAL | 0 refills | Status: DC

## 2018-04-27 NOTE — Telephone Encounter (Signed)
Sent 30 day supply with no refills to [facility] on [facility]. Pt will need to have an ov for future refills

## 2018-04-27 NOTE — Telephone Encounter (Signed)
Contacted pt daughter and informed her that I sent A 30 day supply of medicines till her appointment

## 2018-04-28 ENCOUNTER — Ambulatory Visit: Payer: Medicare Other | Admitting: Internal Medicine

## 2018-06-07 ENCOUNTER — Other Ambulatory Visit: Payer: Self-pay | Admitting: Internal Medicine

## 2018-06-07 DIAGNOSIS — I10 Essential (primary) hypertension: Secondary | ICD-10-CM

## 2018-06-07 NOTE — Telephone Encounter (Signed)
1) Medication(s) Requested (by name): hydrochlorothiazide (HYDRODIURIL) 12.5 MG tablet  lisinopril (PRINIVIL,ZESTRIL) 20 MG tablet   2) Pharmacy of Choice: [place], [place] [facility] [place] 3) Special Requests: PTS called in wanting to get a refill for her mothers medication advised per previous note that she would have to keep her appt and pts daughter seemed upset stated that it would be our fault if something happened to her mother.Advised if something more urgent was needed she would have to go to Urgent care  Approved medications will be sent to the pharmacy, we will reach out if there is an issue.  Requests made after 3pm may not be addressed until the following business day!  If a patient is unsure of the name of the medication(s) please note and ask patient to call back when they are able to provide all info, do not send to responsible party until all information is available!

## 2018-06-08 ENCOUNTER — Other Ambulatory Visit: Payer: Self-pay | Admitting: Internal Medicine

## 2018-06-08 DIAGNOSIS — I10 Essential (primary) hypertension: Secondary | ICD-10-CM

## 2018-06-08 MED ORDER — HYDROCHLOROTHIAZIDE 12.5 MG PO TABS: 12.5000 mg | ORAL_TABLET | Freq: Every day | ORAL | 0 refills | Status: DC

## 2018-06-08 MED ORDER — LISINOPRIL 20 MG PO TABS: 20.0000 mg | ORAL_TABLET | Freq: Every day | ORAL | 0 refills | Status: DC

## 2018-06-14 ENCOUNTER — Ambulatory Visit: Payer: Medicare Other | Attending: Internal Medicine | Admitting: Internal Medicine

## 2018-06-14 ENCOUNTER — Encounter: Payer: Self-pay | Admitting: Internal Medicine

## 2018-06-14 ENCOUNTER — Other Ambulatory Visit: Payer: Self-pay

## 2018-06-14 VITALS — BP 155/72 | HR 80 | Temp 98.1°F | Resp 16 | Wt 199.6 lb

## 2018-06-14 DIAGNOSIS — E669 Obesity, unspecified: Secondary | ICD-10-CM | POA: Diagnosis not present

## 2018-06-14 DIAGNOSIS — M79601 Pain in right arm: Secondary | ICD-10-CM

## 2018-06-14 DIAGNOSIS — G5600 Carpal tunnel syndrome, unspecified upper limb: Secondary | ICD-10-CM | POA: Insufficient documentation

## 2018-06-14 DIAGNOSIS — Z79899 Other long term (current) drug therapy: Secondary | ICD-10-CM | POA: Diagnosis not present

## 2018-06-14 DIAGNOSIS — Z1239 Encounter for other screening for malignant neoplasm of breast: Secondary | ICD-10-CM | POA: Diagnosis not present

## 2018-06-14 DIAGNOSIS — Z6835 Body mass index (BMI) 35.0-35.9, adult: Secondary | ICD-10-CM | POA: Insufficient documentation

## 2018-06-14 DIAGNOSIS — M1990 Unspecified osteoarthritis, unspecified site: Secondary | ICD-10-CM | POA: Diagnosis not present

## 2018-06-14 DIAGNOSIS — I10 Essential (primary) hypertension: Secondary | ICD-10-CM | POA: Insufficient documentation

## 2018-06-14 MED ORDER — DICLOFENAC SODIUM 1 % TD GEL: 2.0000 g | Freq: Four times a day (QID) | TRANSDERMAL | 1 refills | Status: DC

## 2018-06-14 MED ORDER — LISINOPRIL 20 MG PO TABS: 20.0000 mg | ORAL_TABLET | Freq: Every day | ORAL | 1 refills | Status: DC

## 2018-06-14 MED ORDER — HYDROCHLOROTHIAZIDE 25 MG PO TABS: 25.0000 mg | ORAL_TABLET | Freq: Every day | ORAL | 1 refills | Status: DC

## 2018-06-14 MED ORDER — TRAMADOL HCL 50 MG PO TABS: 50.0000 mg | ORAL_TABLET | Freq: Two times a day (BID) | ORAL | 0 refills | Status: AC | PRN

## 2018-06-14 NOTE — Progress Notes (Signed)
Patient ID: Shelly Meyer, female    DOB: 1950-05-13  MRN: [id number]  CC: Hypertension   Subjective: Shelly Meyer is a 68 y.o. female who presents for chronic ds management Her concerns today include:  Pt with hx of CTS, depression, HTN, OA, spinal stenosis, varicose veins s/p laser ablation RT great saph vein 04/2016, migraine.  Last seen in May 2019  p c/o pain in RT arm that started around [facility] in [place] with arms out stretch.  Pain started next day -Constant, no better since December Pain is worse with elevation of the arm above the head.  She has noticed some intermittent tingling in the entire arm.  No weakness. Taking Tylenol Q6 hrs 500 mg  HTN:  Needing RF on meds. Took meds already for today. Limits salt in foods. No getting in much exercise.  She is afraid to go outside because of COVID-19  HM:  Due for MMG, colon CA screen  Patient Active Problem List   Diagnosis Date Noted  . Rotator cuff tendinitis, left 04/19/2017  . Dyspepsia 02/14/2017  . Acute pain of left shoulder 02/14/2017  . Gait disturbance 02/14/2017  . Osteoarthritis 10/29/2015  . Spinal stenosis of lumbar region 02/28/2015  . Carpal tunnel syndrome 02/28/2015  . Varicose veins of bilateral lower extremities with other complications 51/03/5850  . Achilles tendinitis of left lower extremity 02/28/2015  . Depression 02/28/2015     No current outpatient medications on file prior to visit.   No current facility-administered medications on file prior to visit.     No Known Allergies  Social History   Socioeconomic History  . Marital status: Divorced    Spouse name: Not on file  . Number of children: Not on file  . Years of education: Not on file  . Highest education level: Not on file  Occupational History  . Not on file  Social Needs  . Financial resource strain: Not on file  . Food insecurity:    Worry: Not on file    Inability: Not on file  . Transportation needs:   Medical: Not on file    Non-medical: Not on file  Tobacco Use  . Smoking status: Never Smoker  . Smokeless tobacco: Never Used  Substance and Sexual Activity  . Alcohol use: No  . Drug use: No  . Sexual activity: Not on file  Lifestyle  . Physical activity:    Days per week: Not on file    Minutes per session: Not on file  . Stress: Not on file  Relationships  . Social connections:    Talks on phone: Not on file    Gets together: Not on file    Attends religious service: Not on file    Active member of club or organization: Not on file    Attends meetings of clubs or organizations: Not on file    Relationship status: Not on file  . Intimate partner violence:    Fear of current or ex partner: Not on file    Emotionally abused: Not on file    Physically abused: Not on file    Forced sexual activity: Not on file  Other Topics Concern  . Not on file  Social History Narrative  . Not on file    Family History  Problem Relation Age of Onset  . Stroke Mother     Past Surgical History:  Procedure Laterality Date  . ENDOVENOUS ABLATION SAPHENOUS VEIN W/ LASER Right 05/14/2016  endovenous laser ablation right greater saphenous vein by Tinnie Gens MD     ROS: Review of Systems Negative except as stated above  PHYSICAL EXAM: BP (!) 155/72   Pulse 80   Temp 98.1 F (36.7 C) (Oral)   Resp 16   Wt 199 lb 9.6 oz (90.5 kg)   LMP  (LMP Unknown)   SpO2 98%   BMI 36.51 kg/m   Physical Exam BP 160/80 General appearance - alert, well appearing, and in no distress Mental status - normal mood, behavior, speech, dress, motor activity, and thought processes Neck - supple, no significant adenopathy Chest - clear to auscultation, no wheezes, rales or rhonchi, symmetric air entry Heart - normal rate, regular rhythm, normal S1, S2, no murmurs, rubs, clicks or gallops Musculoskeletal -right shoulder: Mild tenderness anterior joint line.  She exhibits discomfort with passive and  active elevation of the arm above the head.  Drop arm test slightly positive.  She still has pretty good strength on grip and extension of the elbow.  Strength mildly decreased on the right with resisted abduction Extremities -no lower extremity edema  CMP Latest Ref Rng & Units 06/13/2017 06/10/2017 02/14/2017  Glucose 65 - 99 mg/dL 106(H) 164(H) 103(H)  BUN 6 - 20 mg/dL 5(L) 8 10  Creatinine 0.44 - 1.00 mg/dL 0.50 0.50 0.53(L)  Sodium 135 - 145 mmol/L 137 140 144  Potassium 3.5 - 5.1 mmol/L 3.4(L) 3.1(L) 4.0  Chloride 101 - 111 mmol/L 100(L) 101 103  CO2 22 - 32 mmol/L 27 - 24  Calcium 8.9 - 10.3 mg/dL 9.3 - 9.7  Total Protein 6.0 - 8.5 g/dL - - 7.1  Total Bilirubin 0.0 - 1.2 mg/dL - - 0.3  Alkaline Phos 39 - 117 IU/L - - 79  AST 0 - 40 IU/L - - 22  ALT 0 - 32 IU/L - - 27   Lipid Panel     Component Value Date/Time   CHOL 207 (H) 02/28/2015 0955   TRIG 247 (H) 02/28/2015 0955   HDL 33 (L) 02/28/2015 0955   CHOLHDL 6.3 (H) 02/28/2015 0955   VLDL 49 (H) 02/28/2015 0955   LDLCALC 125 02/28/2015 0955    CBC    Component Value Date/Time   WBC 6.1 06/13/2017 1032   RBC 4.66 06/13/2017 1032   HGB 14.0 06/13/2017 1032   HGB 13.7 02/14/2017 1638   HCT 41.0 06/13/2017 1032   HCT 40.0 02/14/2017 1638   PLT 255 06/13/2017 1032   PLT 273 02/14/2017 1638   MCV 88.0 06/13/2017 1032   MCV 88 02/14/2017 1638   MCH 30.0 06/13/2017 1032   MCHC 34.1 06/13/2017 1032   RDW 12.6 06/13/2017 1032   RDW 13.1 02/14/2017 1638   LYMPHSABS 2.4 08/26/2015 1625   MONOABS 0.6 08/26/2015 1625   EOSABS 0.2 08/26/2015 1625   BASOSABS 0.0 08/26/2015 1625    ASSESSMENT AND PLAN: 1. Essential hypertension Not at goal.  Increase hydrochlorothiazide to 25 mg daily.  Continue to limit salt in the foods - CBC - Comprehensive metabolic panel - Lipid panel - hydrochlorothiazide (HYDRODIURIL) 25 MG tablet; Take 1 tablet (25 mg total) by mouth daily.  Dispense: 90 tablet; Refill: 1 - lisinopril (ZESTRIL) 20  MG tablet; Take 1 tablet (20 mg total) by mouth daily.  Dispense: 90 tablet; Refill: 1  2. Pain of right upper extremity Likely mild injury to rotator cuff. Ortho referral. Limited supply of tramadol to use as needed.  Advised that the medication can  cause drowsiness.  [place] controlled substance reporting system reviewed - diclofenac sodium (VOLTAREN) 1 % GEL; Apply 2 g topically 4 (four) times daily.  Dispense: 100 g; Refill: 1 - traMADol (ULTRAM) 50 MG tablet; Take 1 tablet (50 mg total) by mouth every 12 (twelve) hours as needed for up to 5 days.  Dispense: 20 tablet; Refill: 0 - Ambulatory referral to Orthopedic Surgery  3. Breast cancer screening - MM Digital Screening; Future  4. Obesity (BMI 35.0-39.9 without comorbidity) Discussed healthy eating habits. Told her that even with the COVID-19 pandemic she can still get out and walk 2-3 times a week just as long as she maintains social distancing when she is help doing her walks.   Patient was given the opportunity to ask questions.  Patient verbalized understanding of the plan and was able to repeat key elements of the plan.  Our employee Gracy Racer  interprets     Requested Prescriptions   Signed Prescriptions Disp Refills  . hydrochlorothiazide (HYDRODIURIL) 25 MG tablet 90 tablet 1    Sig: Take 1 tablet (25 mg total) by mouth daily.  Marland Kitchen lisinopril (ZESTRIL) 20 MG tablet 90 tablet 1    Sig: Take 1 tablet (20 mg total) by mouth daily.  . diclofenac sodium (VOLTAREN) 1 % GEL 100 g 1    Sig: Apply 2 g topically 4 (four) times daily.  . traMADol (ULTRAM) 50 MG tablet 20 tablet 0    Sig: Take 1 tablet (50 mg total) by mouth every 12 (twelve) hours as needed for up to 5 days.    Return in about 3 months (around 09/14/2018).  Karle Plumber, MD, FACP

## 2018-06-14 NOTE — Patient Instructions (Signed)
Hydrochlorothiazide has been increased to 25 mg daily

## 2018-06-15 ENCOUNTER — Other Ambulatory Visit: Payer: Self-pay | Admitting: Internal Medicine

## 2018-06-15 LAB — COMPREHENSIVE METABOLIC PANEL
ALT: 20 IU/L (ref 0–32)
AST: 20 IU/L (ref 0–40)
Albumin/Globulin Ratio: 1.9 (ref 1.2–2.2)
Albumin: 4.5 g/dL (ref 3.8–4.8)
Alkaline Phosphatase: 81 IU/L (ref 39–117)
BUN/Creatinine Ratio: 20 (ref 12–28)
BUN: 11 mg/dL (ref 8–27)
Bilirubin Total: 0.2 mg/dL (ref 0.0–1.2)
CO2: 21 mmol/L (ref 20–29)
Calcium: 9 mg/dL (ref 8.7–10.3)
Chloride: 104 mmol/L (ref 96–106)
Creatinine, Ser: 0.54 mg/dL — ABNORMAL LOW (ref 0.57–1.00)
GFR calc Af Amer: 111 mL/min/{1.73_m2} (ref >59)
GFR calc non Af Amer: 97 mL/min/{1.73_m2} (ref >59)
Globulin, Total: 2.4 g/dL (ref 1.5–4.5)
Glucose: 145 mg/dL — ABNORMAL HIGH (ref 65–99)
Potassium: 3.7 mmol/L (ref 3.5–5.2)
Sodium: 142 mmol/L (ref 134–144)
Total Protein: 6.9 g/dL (ref 6.0–8.5)

## 2018-06-15 LAB — CBC
Hematocrit: 40.5 % (ref 34.0–46.6)
Hemoglobin: 13.7 g/dL (ref 11.1–15.9)
MCH: 30.5 pg (ref 26.6–33.0)
MCHC: 33.8 g/dL (ref 31.5–35.7)
MCV: 90 fL (ref 79–97)
Platelets: 275 10*3/uL (ref 150–450)
RBC: 4.49 x10E6/uL (ref 3.77–5.28)
RDW: 12.4 % (ref 11.7–15.4)
WBC: 7.1 10*3/uL (ref 3.4–10.8)

## 2018-06-15 LAB — LIPID PANEL
Chol/HDL Ratio: 5.7 ratio — ABNORMAL HIGH (ref 0.0–4.4)
Cholesterol, Total: 201 mg/dL — ABNORMAL HIGH (ref 100–199)
HDL: 35 mg/dL — ABNORMAL LOW (ref >39)
LDL Calculated: 115 mg/dL — ABNORMAL HIGH (ref 0–99)
Triglycerides: 253 mg/dL — ABNORMAL HIGH (ref 0–149)
VLDL Cholesterol Cal: 51 mg/dL — ABNORMAL HIGH (ref 5–40)

## 2018-06-15 MED ORDER — ATORVASTATIN CALCIUM 10 MG PO TABS: 10.0000 mg | ORAL_TABLET | Freq: Every day | ORAL | 3 refills | Status: DC

## 2018-06-16 ENCOUNTER — Telehealth: Payer: Self-pay | Admitting: Emergency Medicine

## 2018-06-16 NOTE — Telephone Encounter (Signed)
Nurse called the patient's home phone number but received no answer and message was left on the voicemail for the patient to call back.  Return phone number given. 

## 2018-06-19 ENCOUNTER — Telehealth: Payer: Self-pay

## 2018-06-19 NOTE — Telephone Encounter (Signed)
[facility] Thurman Coyer  [phone] contacted pt to go over lab results pt didn't answer left a detailed vm informing pt of results and if she has any questions or concerns to give me a call

## 2018-06-21 ENCOUNTER — Ambulatory Visit: Payer: Medicare Other | Admitting: Orthopaedic Surgery

## 2018-06-28 ENCOUNTER — Telehealth: Payer: Self-pay | Admitting: Emergency Medicine

## 2018-06-28 ENCOUNTER — Telehealth: Payer: Self-pay | Admitting: Internal Medicine

## 2018-06-28 NOTE — Telephone Encounter (Signed)
New Message ° ° °Pt calling to get lab results °

## 2018-06-28 NOTE — Telephone Encounter (Signed)
Nurse called the patient's home phone number but received no answer and message was left on the voicemail for the patient to call back.  Return phone number given. 

## 2018-06-29 NOTE — Telephone Encounter (Signed)
Patient contacted via phone to be given results of labs.  Patient's daughter identified patient by name and date of birth. Patient's daughter given results of labs.  Patient's educated on lab results. Questions answered. Patient's daughter acknowledged understanding of labs results.

## 2018-07-12 ENCOUNTER — Other Ambulatory Visit: Payer: Self-pay | Admitting: Internal Medicine

## 2018-07-12 DIAGNOSIS — I10 Essential (primary) hypertension: Secondary | ICD-10-CM

## 2018-07-14 NOTE — Telephone Encounter (Signed)
Patient sent letter indicating the need to call for lab results

## 2018-09-06 ENCOUNTER — Other Ambulatory Visit: Payer: Self-pay

## 2018-09-06 ENCOUNTER — Ambulatory Visit
Admission: RE | Admit: 2018-09-06 | Discharge: 2018-09-06 | Disposition: A | Discharge status: Home / Self Care | Payer: Medicare Other | Source: Ambulatory Visit | Attending: Internal Medicine | Admitting: Internal Medicine

## 2018-09-06 DIAGNOSIS — Z1239 Encounter for other screening for malignant neoplasm of breast: Secondary | ICD-10-CM

## 2018-09-06 DIAGNOSIS — Z1231 Encounter for screening mammogram for malignant neoplasm of breast: Secondary | ICD-10-CM | POA: Diagnosis not present

## 2018-09-18 ENCOUNTER — Telehealth: Payer: Self-pay

## 2018-09-18 NOTE — Telephone Encounter (Signed)
[employer] Gilmore Laroche  Id#  [id number] contacted pt to go over MM results pt didn't answer left a detailed vm informing pt of results and if she ha any questions or concerns to give me a call

## 2018-12-11 ENCOUNTER — Ambulatory Visit: Payer: Medicare Other | Admitting: Pharmacist

## 2018-12-15 ENCOUNTER — Ambulatory Visit: Payer: Medicare Other | Admitting: Pharmacist

## 2018-12-15 NOTE — Progress Notes (Signed)
Patient did not show.

## 2019-01-20 ENCOUNTER — Other Ambulatory Visit: Payer: Self-pay | Admitting: Internal Medicine

## 2019-01-20 DIAGNOSIS — I10 Essential (primary) hypertension: Secondary | ICD-10-CM

## 2019-01-28 ENCOUNTER — Other Ambulatory Visit: Payer: Self-pay | Admitting: Internal Medicine

## 2019-01-28 DIAGNOSIS — I10 Essential (primary) hypertension: Secondary | ICD-10-CM

## 2019-01-29 ENCOUNTER — Other Ambulatory Visit: Payer: Self-pay | Admitting: Internal Medicine

## 2019-01-29 DIAGNOSIS — I10 Essential (primary) hypertension: Secondary | ICD-10-CM

## 2019-02-26 ENCOUNTER — Other Ambulatory Visit: Payer: Self-pay | Admitting: Internal Medicine

## 2019-02-26 DIAGNOSIS — I10 Essential (primary) hypertension: Secondary | ICD-10-CM

## 2019-03-06 ENCOUNTER — Other Ambulatory Visit: Payer: Self-pay | Admitting: Internal Medicine

## 2019-03-06 DIAGNOSIS — I10 Essential (primary) hypertension: Secondary | ICD-10-CM

## 2019-03-16 ENCOUNTER — Other Ambulatory Visit: Payer: Self-pay

## 2019-03-16 ENCOUNTER — Ambulatory Visit: Payer: Medicare Other | Attending: Family | Admitting: Family

## 2019-03-16 DIAGNOSIS — I10 Essential (primary) hypertension: Secondary | ICD-10-CM | POA: Diagnosis not present

## 2019-03-16 DIAGNOSIS — E785 Hyperlipidemia, unspecified: Secondary | ICD-10-CM

## 2019-03-16 MED ORDER — ATORVASTATIN CALCIUM 10 MG PO TABS: 10.0000 mg | ORAL_TABLET | Freq: Every day | ORAL | 0 refills | Status: DC

## 2019-03-16 MED ORDER — LISINOPRIL 20 MG PO TABS: 20.0000 mg | ORAL_TABLET | Freq: Every day | ORAL | 0 refills | Status: DC

## 2019-03-16 MED ORDER — HYDROCHLOROTHIAZIDE 25 MG PO TABS: 25.0000 mg | ORAL_TABLET | Freq: Every day | ORAL | 0 refills | Status: DC

## 2019-03-16 NOTE — Progress Notes (Signed)
Virtual Visit via Telephone Note  I connected with Shelly Meyer, on 03/16/2019 at 1:43 PM by telephone due to the COVID-19 pandemic and verified that I am speaking with the correct person using two identifiers.  Due to current restrictions/limitations of in-office visits due to the COVID-19 pandemic, this scheduled clinical appointment was converted to a telehealth visit.   Consent: I discussed the limitations, risks, security and privacy concerns of performing an evaluation and management service by telephone and the availability of in person appointments. I also discussed with the patient that there may be a patient responsible charge related to this service. The patient expressed understanding and agreed to proceed.   Location of Patient: Home  Location of Provider: [place] [facility] [place]   Persons participating in Telemedicine visit: Priscilla Soifer, NP [place]av[place], [place], [place], [facility] [employer], [place] [facility]  History of Present Illness:  1. HYPERTENSION FOLLOW-UP:  Currently taking: see medication list Med Adherence: [x]  Yes    []  No Medication side effects: [x]  Yes    []  No Adherence with salt restriction: []  Yes    [x]  No Home Monitoring?: []  Yes    [x]  No Monitoring Frequency: []  Yes    [x]  No   Home BP results range: []  Yes    [x]  No SOB? []  Yes    [x]  No Chest Pain?: []  Yes    [x]  No Leg swelling?: []  Yes    [x]  No Headaches?: [x]  Yes    []  No Dizziness? []  Yes    [x]  No Comments: Feeling bad since 3 days ago with headaches. Has not taken HCTZ for 7 days was only given 30 day supply at the pharmacy and given Lisinopril 3 months supply. Denies low-salt diet. Admits exercising at least 20 minutes most days.  2. HYPERLIPIDEMIA FOLLOW-UP:  Last Lipid Panel results:  HDL  Date Value Ref Range Status  06/14/2018 35 (L) >39 mg/dL Final   Triglycerides  Date Value Ref Range Status  06/14/2018 253 (H) 0 - 149 mg/dL Final     Med Adherence: []  Yes    [x]  No Medication side effects: []  Yes    [x]  No Muscle aches:  []  Yes    [x]  No Diet Adherence: []  Yes    [x]  No Comments:  Denies low-fat diet. Reports exercises at least 20 minutes most days.  Past Medical History:  Diagnosis Date  . Carpal tunnel syndrome 02/28/2015  . Depression 02/28/2015  . Hypertension   . Osteoarthritis   . Spinal stenosis of lumbar region 02/28/2015  . Varicose veins of both lower extremities    No Known Allergies  Current Outpatient Medications on File Prior to Visit  Medication Sig Dispense Refill  . atorvastatin (LIPITOR) 10 MG tablet Take 1 tablet (10 mg total) by mouth daily. 90 tablet 3  . diclofenac sodium (VOLTAREN) 1 % GEL Apply 2 g topically 4 (four) times daily. 100 g 1  . hydrochlorothiazide (HYDRODIURIL) 25 MG tablet Take 1 tablet (25 mg total) by mouth daily. Must have office visit for refills. 30 tablet 0  . lisinopril (ZESTRIL) 20 MG tablet Take 1 tablet (20 mg total) by mouth daily. Must have office visit for refills 90 tablet 0   No current facility-administered medications on file prior to visit.    Observations/Objective: Alert and oriented x 3. Not in acute distress. Physical examination not completed as this is a telemedicine visit.  Assessment and Plan: 1. Essential hypertension: - lisinopril (ZESTRIL)  20 MG tablet; Take 1 tablet (20 mg total) by mouth daily.  Dispense: 90 tablet; Refill: 0 - hydrochlorothiazide (HYDRODIURIL) 25 MG tablet; Take 1 tablet (25 mg total) by mouth daily.  Dispense: 90 tablet; Refill: 0 -Counseled on blood pressure goal of less than 130/80, low-sodium, DASH diet, medication compliance, 150 minutes of moderate intensity exercise per week. Discussed medication compliance, adverse effects.  2. Hyperlipidemia, unspecified hyperlipidemia type: - atorvastatin (LIPITOR) 10 MG tablet; Take 1 tablet (10 mg total) by mouth daily.  Dispense: 90 tablet; Refill: 0 -Counseled on low-fat,  DASH diet, medication compliance, 150 minutes of moderate intensity exercise per week. Discussed medication compliance, adverse effects.   Follow Up Instructions: Follow-up with attending physician in about 3 months for management of chronic conditions.  Patient was given clear instructions to go to Emergency Department or return to medical center if symptoms don't improve, worsen, or new problems develop.The patient verbalized understanding.   I discussed the assessment and treatment plan with the patient. The patient was provided an opportunity to ask questions and all were answered. The patient agreed with the plan and demonstrated an understanding of the instructions.   The patient was advised to call back or seek an in-person evaluation if the symptoms worsen or if the condition fails to improve as anticipated.   I provided 25 minutes total of non-face-to-face time during this encounter including median intraservice time, reviewing previous notes, labs, imaging, medications, management and patient verbalized understanding.    Camillia Herter, NP  [facility] [place], [place]   03/16/2019, 1:43 PM

## 2019-05-04 ENCOUNTER — Other Ambulatory Visit: Payer: Self-pay | Admitting: Internal Medicine

## 2019-05-04 DIAGNOSIS — I10 Essential (primary) hypertension: Secondary | ICD-10-CM

## 2019-05-12 IMAGING — DX DG CHEST 2V
2 series · 2 of 2 positions shown · non-contrast
Comparison: Shoulder radiograph 02/17/2017; chest radiograph
02/08/2009

CLINICAL DATA: Follow-up chest radiograph

EXAM:
CHEST  2 VIEW

[chest pa]
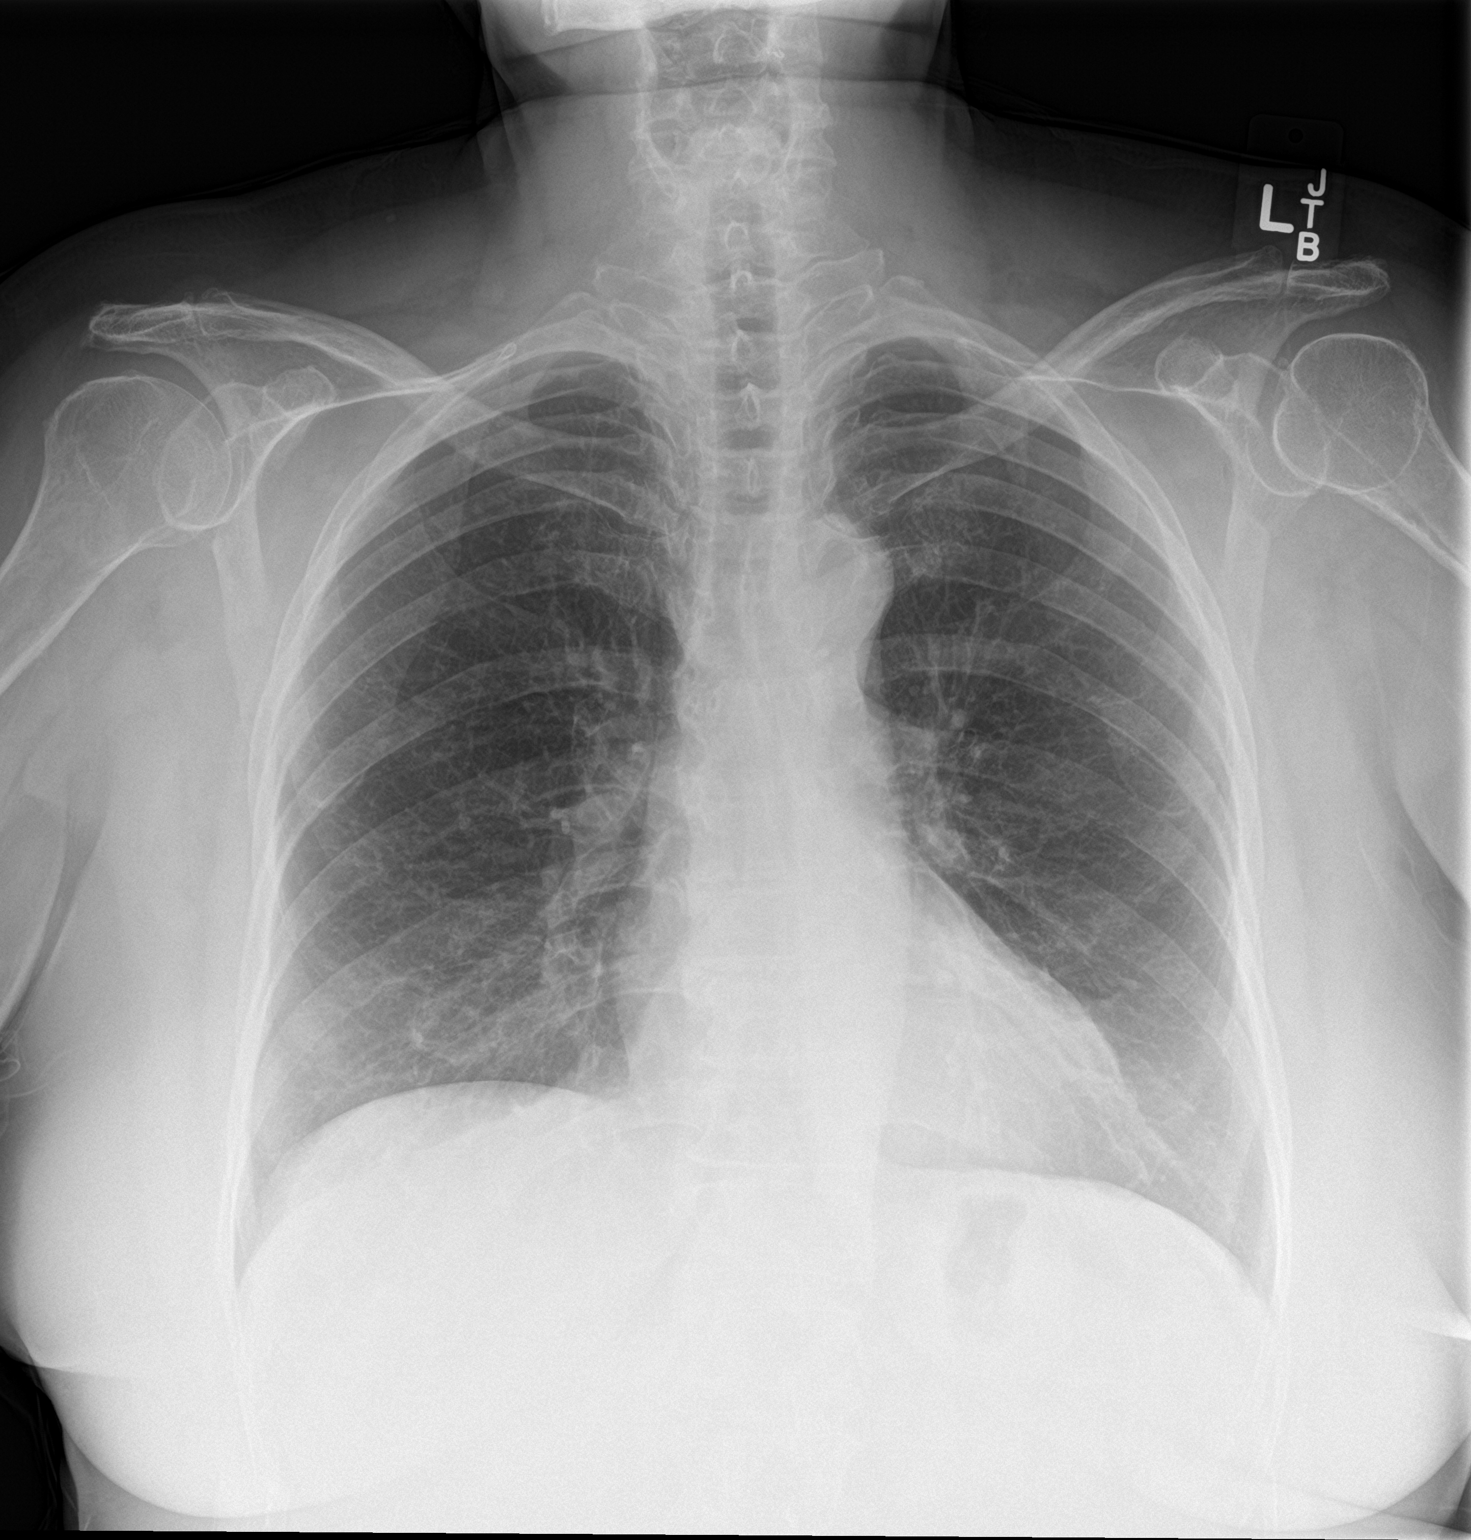

[chest lat]
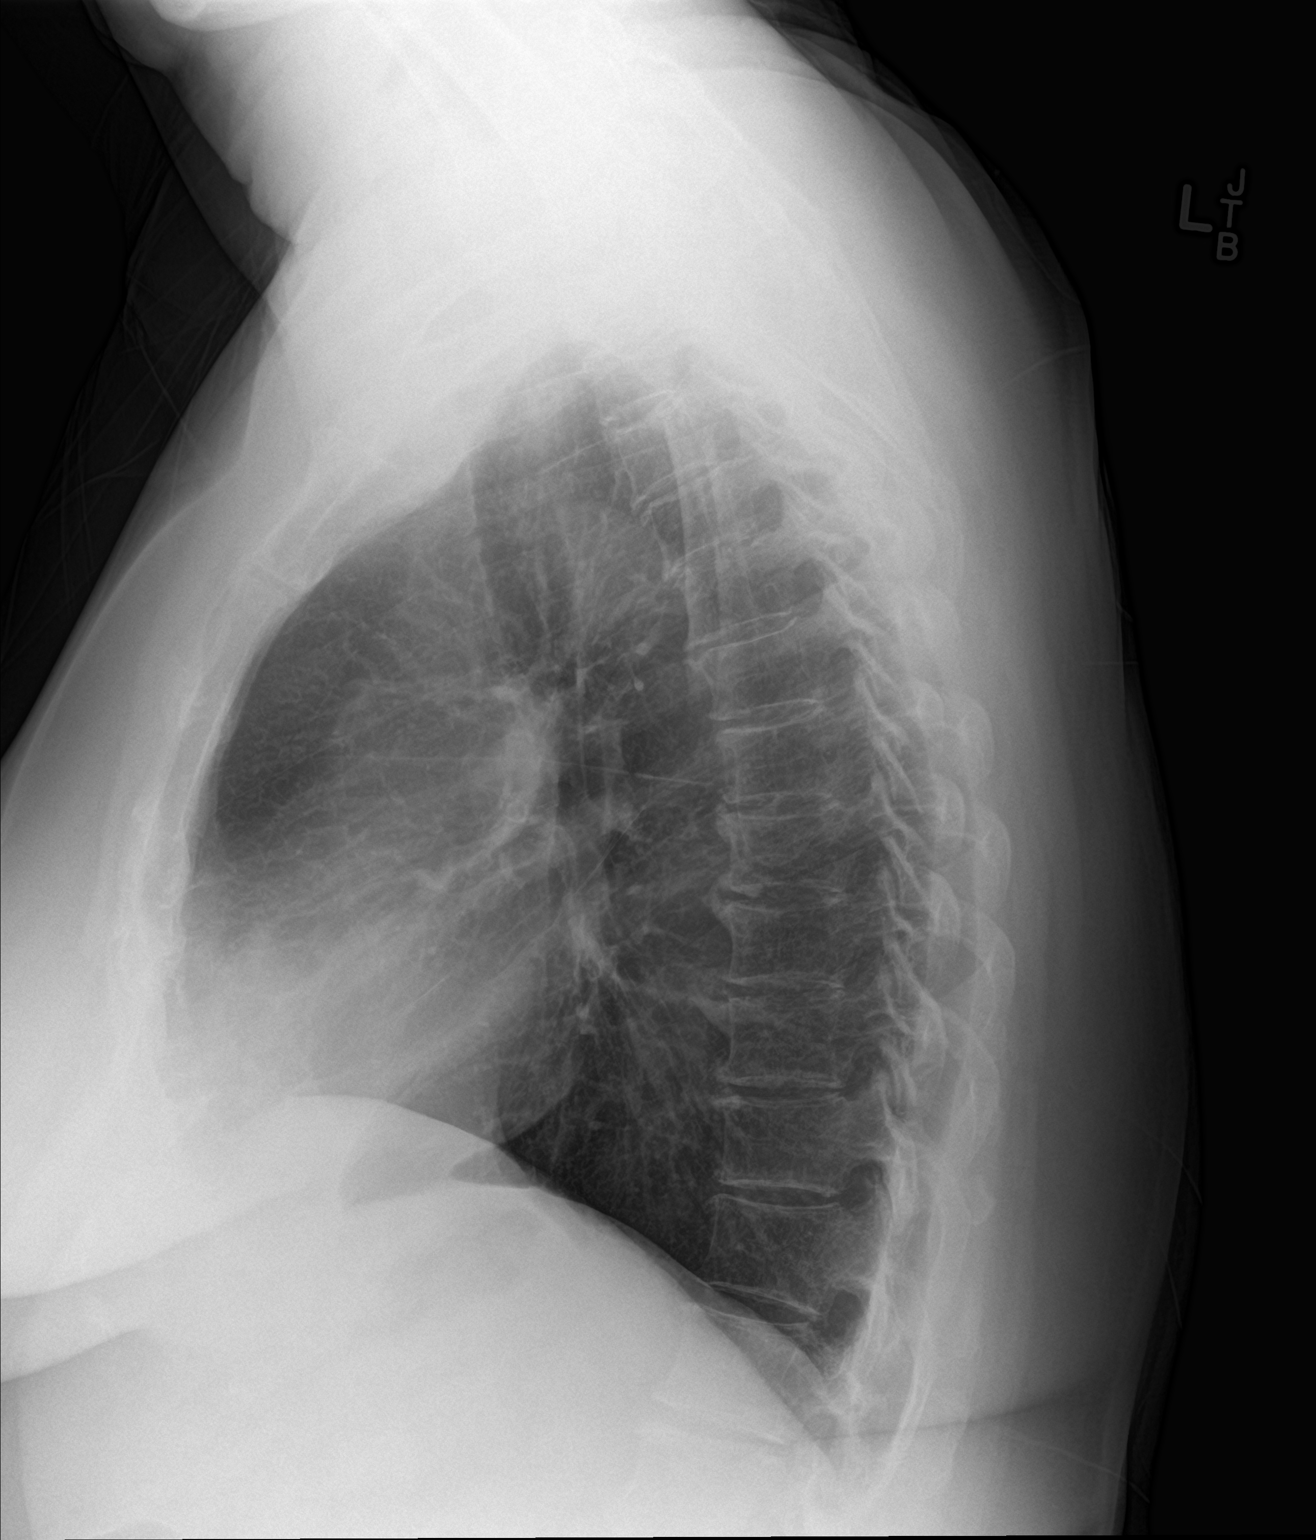

[2 of 2 positions shown; findings below may reference images not displayed]

FINDINGS: Normal cardiac and mediastinal contours. No consolidative pulmonary
opacities. No pleural effusion or pneumothorax. Thoracic spine
degenerative changes.
IMPRESSION: No acute cardiopulmonary process.

## 2019-06-11 ENCOUNTER — Other Ambulatory Visit: Payer: Self-pay | Admitting: Family

## 2019-06-11 DIAGNOSIS — E785 Hyperlipidemia, unspecified: Secondary | ICD-10-CM

## 2019-06-11 DIAGNOSIS — I10 Essential (primary) hypertension: Secondary | ICD-10-CM

## 2019-06-26 ENCOUNTER — Other Ambulatory Visit: Payer: Self-pay

## 2019-06-26 ENCOUNTER — Encounter: Payer: Self-pay | Admitting: Family Medicine

## 2019-06-26 ENCOUNTER — Ambulatory Visit: Payer: Medicare Other | Attending: Family Medicine | Admitting: Family Medicine

## 2019-06-26 VITALS — BP 120/76 | HR 68 | Ht 62.0 in | Wt 193.2 lb

## 2019-06-26 DIAGNOSIS — R0789 Other chest pain: Secondary | ICD-10-CM | POA: Diagnosis not present

## 2019-06-26 DIAGNOSIS — M1990 Unspecified osteoarthritis, unspecified site: Secondary | ICD-10-CM | POA: Insufficient documentation

## 2019-06-26 DIAGNOSIS — Z79899 Other long term (current) drug therapy: Secondary | ICD-10-CM | POA: Diagnosis not present

## 2019-06-26 DIAGNOSIS — G629 Polyneuropathy, unspecified: Secondary | ICD-10-CM | POA: Diagnosis not present

## 2019-06-26 DIAGNOSIS — E785 Hyperlipidemia, unspecified: Secondary | ICD-10-CM | POA: Diagnosis not present

## 2019-06-26 DIAGNOSIS — K219 Gastro-esophageal reflux disease without esophagitis: Secondary | ICD-10-CM | POA: Diagnosis not present

## 2019-06-26 DIAGNOSIS — I10 Essential (primary) hypertension: Secondary | ICD-10-CM | POA: Diagnosis present

## 2019-06-26 DIAGNOSIS — I452 Bifascicular block: Secondary | ICD-10-CM | POA: Diagnosis not present

## 2019-06-26 DIAGNOSIS — Z131 Encounter for screening for diabetes mellitus: Secondary | ICD-10-CM | POA: Diagnosis not present

## 2019-06-26 DIAGNOSIS — Z823 Family history of stroke: Secondary | ICD-10-CM | POA: Diagnosis not present

## 2019-06-26 LAB — POCT GLYCOSYLATED HEMOGLOBIN (HGB A1C): HbA1c, POC (prediabetic range): 5.7 % (ref 5.7–6.4)

## 2019-06-26 MED ORDER — OMEPRAZOLE 40 MG PO CPDR: 40.0000 mg | DELAYED_RELEASE_CAPSULE | Freq: Every day | ORAL | 3 refills | Status: DC

## 2019-06-26 NOTE — Patient Instructions (Signed)
Opciones de alimentos para pacientes adultos con enfermedad de reflujo gastroesofgico Food Choices for Gastroesophageal Reflux Disease, Adult Si tiene enfermedad de reflujo gastroesofgico (ERGE), los alimentos que consume y los hbitos de alimentacin son muy importantes. Elegir los alimentos adecuados puede ayudar a aliviar las molestias. Piense en consultar a un especialista en nutricin (nutricionista) para que lo ayude a hacer buenas elecciones. Consejos para seguir este plan  Comidas  Elija alimentos saludables con bajo contenido de grasa, como frutas, verduras, cereales integrales, productos lcteos descremados y carne magra de vaca, de pescado y de ave.  Haga comidas pequeas durante el da en lugar de 3 comidas abundantes. Coma lentamente y en un lugar donde est distendido. Evite agacharse o recostarse hasta 2 o 3horas despus de haber comido.  Evite comer 2 a 3horas antes de ir a acostarse.  Evite beber grandes cantidades de lquidos con las comidas.  Evite frer los alimentos a la hora de la coccin. Puede hornear, grillar o asar a la parrilla.  Evite o limite la cantidad de: ? Chocolate. ? Menta y mentol. ? Alcohol. ? Pimienta. ? Caf negro y descafeinado. ? T negro y descafeinado. ? Bebidas con gas (gaseosas). ? Bebidas energizantes y refrescos que contengan cafena.  Limite los alimentos con alto contenido de grasas, por ejemplo: ? Carnes grasas o alimentos fritos. ? Leche entera, crema, manteca o helado. ? Nueces y mantequillas de frutos secos. ? Pastelera, donas y dulces hechos con manteca o margarina.  Evite los alimentos que le ocasionen sntomas. Estos pueden ser distintos para cada persona. Los alimentos que suelen causan sntomas son los siguientes: ? Tomates. ? Naranjas, limones y limas. ? Pimientos. ? Comidas condimentadas. ? Cebolla y ajo. ? Vinagre. Estilo de vida  Mantenga un peso saludable. Pregntele a su mdico cul es el peso saludable  para usted. Si necesita perder peso, hable con su mdico para hacerlo de manera segura.  Realice actividad fsica durante, al menos, 30 minutos 5 das por semana o ms, o segn lo indicado por su mdico.  Use ropa suelta.  No fume. Si necesita ayuda para dejar de fumar, consulte al mdico.  Duerma con la cabecera de la cama ms elevada que los pies. Use una cua debajo del colchn o bloques debajo del armazn de la cama para mantener la cabecera de la cama elevada. Resumen  Si tiene enfermedad de reflujo gastroesofgico (ERGE), las elecciones de alimentos y el estilo de vida son muy importantes para ayudar a aliviar los sntomas.  Haga comidas pequeas durante el da en lugar de 3 comidas abundantes. Coma lentamente y en un lugar donde est distendido.  Limite los alimentos con alto contenido graso como la carne grasa o los alimentos fritos.  Evite agacharse o recostarse hasta 2 o 3horas despus de haber comido.  Evite la menta y hierba buena, la cafena, el alcohol y el chocolate. Esta informacin no tiene como fin reemplazar el consejo del mdico. Asegrese de hacerle al mdico cualquier pregunta que tenga. Document Revised: 08/31/2016 Document Reviewed: 08/31/2016 Elsevier Patient Education  2020 [employer]  

## 2019-06-26 NOTE — Progress Notes (Signed)
Subjective:  Patient ID: Shelly Meyer, female    DOB: 08/04/1950  Age: 69 y.o. MRN: [id number]  CC: Chest Pain   HPI Shelly Meyer is a 69 year old female with Hypertension, GERD here for an acute visit. Complains on numbness in both arms which still persists and occurs in her feet as well. Her feet are dry and she feels she might thave Diabetes. A1c in 04/2017 was 5.5. 3-4 days ago she had pain in her left hemithorax which was poking and resolved spontaneously.Pain was unrelated to activity and not associated with dyspnea, nausea or vomiting. She endorses the presence of reflux and sour taste in her mouth but no abdominal pain.  Compliant with her statin and her antihypertensive.  Past Medical History:  Diagnosis Date  . Carpal tunnel syndrome 02/28/2015  . Depression 02/28/2015  . Hypertension   . Osteoarthritis   . Spinal stenosis of lumbar region 02/28/2015  . Varicose veins of both lower extremities     Past Surgical History:  Procedure Laterality Date  . ENDOVENOUS ABLATION SAPHENOUS VEIN W/ LASER Right 05/14/2016   endovenous laser ablation right greater saphenous vein by Tinnie Gens MD     Family History  Problem Relation Age of Onset  . Stroke Mother     No Known Allergies  Outpatient Medications Prior to Visit  Medication Sig Dispense Refill  . atorvastatin (LIPITOR) 10 MG tablet TAKE 1 TABLET(10 MG) BY MOUTH DAILY 90 tablet 0  . diclofenac sodium (VOLTAREN) 1 % GEL Apply 2 g topically 4 (four) times daily. 100 g 1  . hydrochlorothiazide (HYDRODIURIL) 25 MG tablet TAKE 1 TABLET(25 MG) BY MOUTH DAILY 90 tablet 0  . lisinopril (ZESTRIL) 20 MG tablet TAKE 1 TABLET(20 MG) BY MOUTH DAILY 90 tablet 0   No facility-administered medications prior to visit.     ROS Review of Systems  Constitutional: Negative for activity change, appetite change and fatigue.  HENT: Negative for congestion, sinus pressure and sore throat.   Eyes: Negative for visual disturbance.    Respiratory: Negative for cough, chest tightness, shortness of breath and wheezing.   Cardiovascular: Positive for chest pain. Negative for palpitations.  Gastrointestinal: Negative for abdominal distention, abdominal pain and constipation.  Endocrine: Negative for polydipsia.  Genitourinary: Negative for dysuria and frequency.  Musculoskeletal: Negative for arthralgias and back pain.  Skin: Negative for rash.  Neurological: Negative for tremors, light-headedness and numbness.  Hematological: Does not bruise/bleed easily.  Psychiatric/Behavioral: Negative for agitation and behavioral problems.    Objective:  BP 120/76   Pulse 68   Ht 5' 2"  (1.575 m)   Wt 193 lb 3.2 oz (87.6 kg)   LMP  (LMP Unknown)   SpO2 98%   BMI 35.34 kg/m   BP/Weight 06/26/2019 06/14/2018 8/75/6433  Systolic BP 295 188 416  Diastolic BP 76 72 86  Wt. (Lbs) 193.2 199.6 189.6  BMI 35.34 36.51 34.68      Physical Exam Constitutional:      Appearance: She is well-developed.  Neck:     Vascular: No JVD.  Cardiovascular:     Rate and Rhythm: Normal rate.     Heart sounds: Normal heart sounds. No murmur.  Pulmonary:     Effort: Pulmonary effort is normal.     Breath sounds: Normal breath sounds. No wheezing or rales.  Chest:     Chest wall: No tenderness.  Abdominal:     General: Bowel sounds are normal. There is no distension.  Palpations: Abdomen is soft. There is no mass.     Tenderness: There is no abdominal tenderness.  Musculoskeletal:        General: Normal range of motion.     Right lower leg: No edema.     Left lower leg: No edema.  Neurological:     Mental Status: She is alert and oriented to person, place, and time.  Psychiatric:        Mood and Affect: Mood normal.     CMP Latest Ref Rng & Units 06/14/2018 06/13/2017 06/10/2017  Glucose 65 - 99 mg/dL 145(H) 106(H) 164(H)  BUN 8 - 27 mg/dL 11 5(L) 8  Creatinine 0.57 - 1.00 mg/dL 0.54(L) 0.50 0.50  Sodium 134 - 144 mmol/L 142 137 140   Potassium 3.5 - 5.2 mmol/L 3.7 3.4(L) 3.1(L)  Chloride 96 - 106 mmol/L 104 100(L) 101  CO2 20 - 29 mmol/L 21 27 -  Calcium 8.7 - 10.3 mg/dL 9.0 9.3 -  Total Protein 6.0 - 8.5 g/dL 6.9 - -  Total Bilirubin 0.0 - 1.2 mg/dL 0.2 - -  Alkaline Phos 39 - 117 IU/L 81 - -  AST 0 - 40 IU/L 20 - -  ALT 0 - 32 IU/L 20 - -    Lipid Panel     Component Value Date/Time   CHOL 201 (H) 06/14/2018 1447   TRIG 253 (H) 06/14/2018 1447   HDL 35 (L) 06/14/2018 1447   CHOLHDL 5.7 (H) 06/14/2018 1447   CHOLHDL 6.3 (H) 02/28/2015 0955   VLDL 49 (H) 02/28/2015 0955   LDLCALC 115 (H) 06/14/2018 1447    CBC    Component Value Date/Time   WBC 7.1 06/14/2018 1447   WBC 6.1 06/13/2017 1032   RBC 4.49 06/14/2018 1447   RBC 4.66 06/13/2017 1032   HGB 13.7 06/14/2018 1447   HCT 40.5 06/14/2018 1447   PLT 275 06/14/2018 1447   MCV 90 06/14/2018 1447   MCH 30.5 06/14/2018 1447   MCH 30.0 06/13/2017 1032   MCHC 33.8 06/14/2018 1447   MCHC 34.1 06/13/2017 1032   RDW 12.4 06/14/2018 1447   LYMPHSABS 2.4 08/26/2015 1625   MONOABS 0.6 08/26/2015 1625   EOSABS 0.2 08/26/2015 1625   BASOSABS 0.0 08/26/2015 1625    Lab Results  Component Value Date   HGBA1C 5.7 06/26/2019    The 10-year ASCVD risk score Mikey Bussing DC Jr., et al., 2013) is: 12.2%   Values used to calculate the score:     Age: 18 years     Sex: Female     Is Non-Hispanic African American: No     Diabetic: No     Tobacco smoker: No     Systolic Blood Pressure: 431 mmHg     Is BP treated: Yes     HDL Cholesterol: 35 mg/dL     Total Cholesterol: 201 mg/dL  Assessment & Plan:   1. 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 Screening for diabetes mellitus A1c is 5.7 - POCT glycosylated  hemoglobin (Hb A1C)  3. Hyperlipidemia, unspecified hyperlipidemia type Increased 10 yr ASCVD risk of 12.7 Continue statin Check lipid panel today - Lipid panel  4. Essential hypertension Controlled Continue lisinopril - CMP14+EGFR  5. Gastroesophageal reflux disease  without esophagitis Could explain her chest pain - omeprazole (PRILOSEC) 40 MG capsule; Take 1 capsule (40 mg total) by mouth daily.  Dispense: 30 capsule; Refill: 3  6. Neuropathy Labs have ruled out DM as etiology We will check B12 level - Vitamin B12    Charlott Rakes, MD, FAAFP. [facility] [place], [place]   06/26/2019, 9:42 AM

## 2019-06-27 LAB — CMP14+EGFR
ALT: 20 IU/L (ref 0–32)
AST: 17 IU/L (ref 0–40)
Albumin/Globulin Ratio: 1.8 (ref 1.2–2.2)
Albumin: 4.4 g/dL (ref 3.8–4.8)
Alkaline Phosphatase: 86 IU/L (ref 48–121)
BUN/Creatinine Ratio: 18 (ref 12–28)
BUN: 10 mg/dL (ref 8–27)
Bilirubin Total: 0.5 mg/dL (ref 0.0–1.2)
CO2: 26 mmol/L (ref 20–29)
Calcium: 9.4 mg/dL (ref 8.7–10.3)
Chloride: 101 mmol/L (ref 96–106)
Creatinine, Ser: 0.56 mg/dL — ABNORMAL LOW (ref 0.57–1.00)
GFR calc Af Amer: 109 mL/min/{1.73_m2} (ref >59)
GFR calc non Af Amer: 95 mL/min/{1.73_m2} (ref >59)
Globulin, Total: 2.5 g/dL (ref 1.5–4.5)
Glucose: 107 mg/dL — ABNORMAL HIGH (ref 65–99)
Potassium: 4.3 mmol/L (ref 3.5–5.2)
Sodium: 141 mmol/L (ref 134–144)
Total Protein: 6.9 g/dL (ref 6.0–8.5)

## 2019-06-27 LAB — VITAMIN B12: Vitamin B-12: 321 pg/mL (ref 232–1245)

## 2019-06-27 LAB — LIPID PANEL
Chol/HDL Ratio: 3.9 ratio (ref 0.0–4.4)
Cholesterol, Total: 150 mg/dL (ref 100–199)
HDL: 38 mg/dL — ABNORMAL LOW (ref >39)
LDL Chol Calc (NIH): 88 mg/dL (ref 0–99)
Triglycerides: 136 mg/dL (ref 0–149)
VLDL Cholesterol Cal: 24 mg/dL (ref 5–40)

## 2019-06-29 ENCOUNTER — Telehealth: Payer: Self-pay

## 2019-06-29 NOTE — Telephone Encounter (Signed)
Patient name and DOB has been verified Patient was informed of lab results. Patient had no questions.  

## 2019-06-29 NOTE — Telephone Encounter (Signed)
-----   Message from Charlott Rakes, MD sent at 06/27/2019  9:17 AM EDT ----- Please inform the patient that labs are normal. Thank you.

## 2019-08-09 ENCOUNTER — Other Ambulatory Visit: Payer: Self-pay | Admitting: Family

## 2019-08-09 DIAGNOSIS — I10 Essential (primary) hypertension: Secondary | ICD-10-CM

## 2019-08-15 IMAGING — CT CT HEAD W/O CM
3 series · 15 of 47 positions shown, 18 images · non-contrast
Comparison: 02/17/2017

CLINICAL DATA: Patient complains of 5 days of general weakness with
dizziness. Seen at [facility] and given meds for dizziness and
nausea. Alert and oriented, complains of general headache. No neuro
deficits

EXAM:
CT HEAD WITHOUT CONTRAST
TECHNIQUE: Contiguous axial images were obtained from the base of the skull
through the vertex without intravenous contrast.

[Series 3: head 5.0 h30s · axial · 0.45mm/px · z∈[-81,+54]mm · 9 of 33 slices shown, 12 images]
[im 3/33  brain]
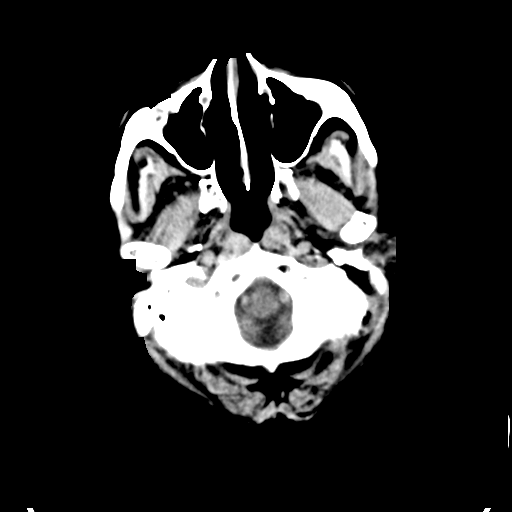
[im 3/33  bone]
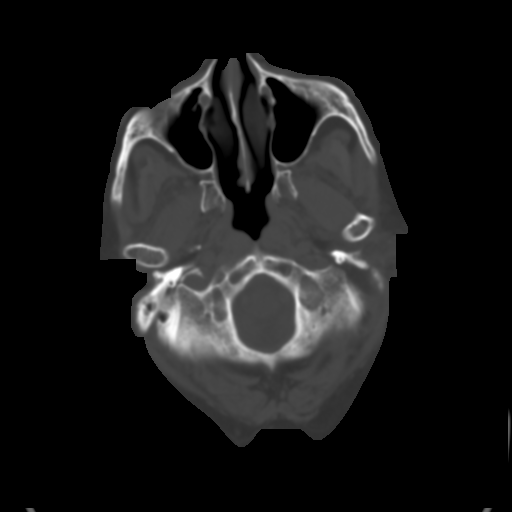
[im 6/33  brain]
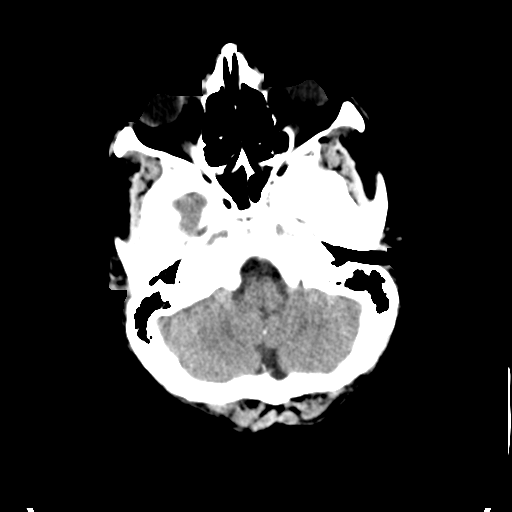
[im 9/33  brain]
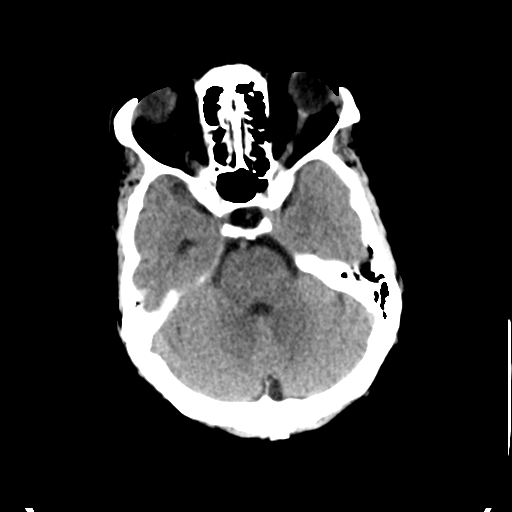
[im 13/33  brain]
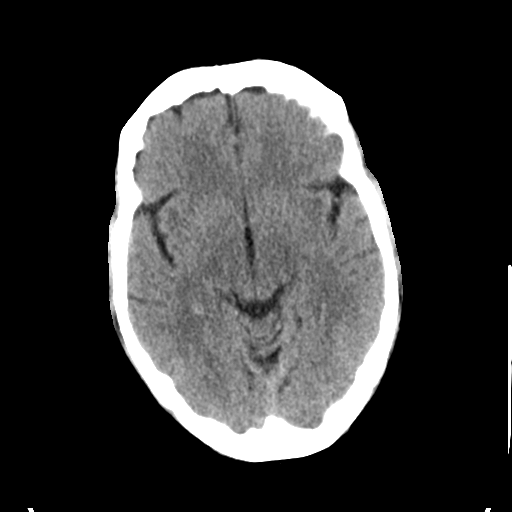
[im 17/33  brain]
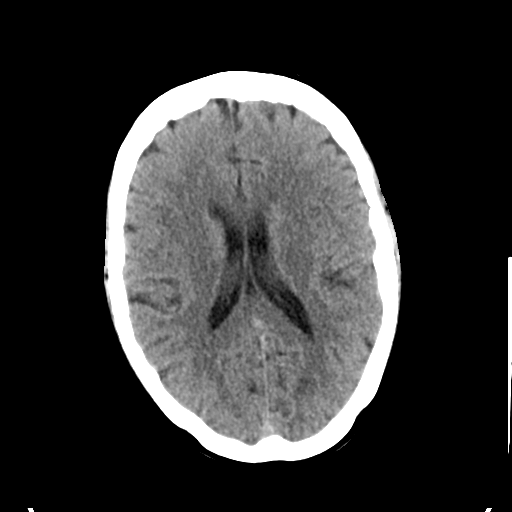
[im 17/33  bone]
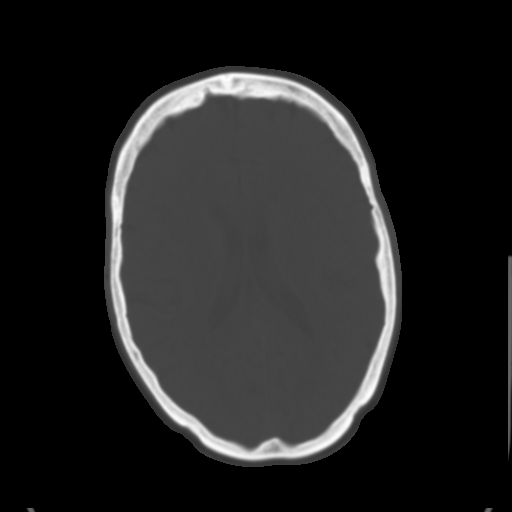
[im 20/33  brain]
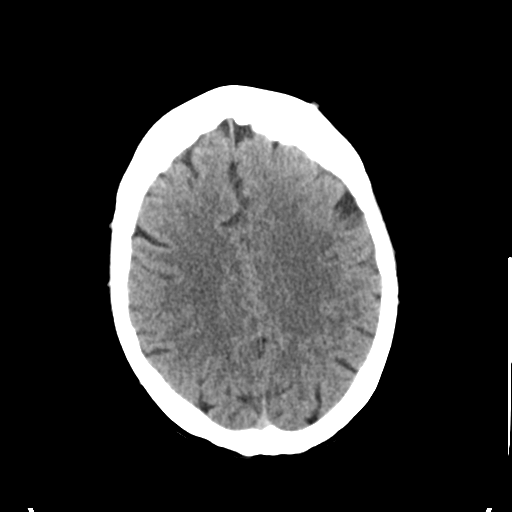
[im 24/33  brain]
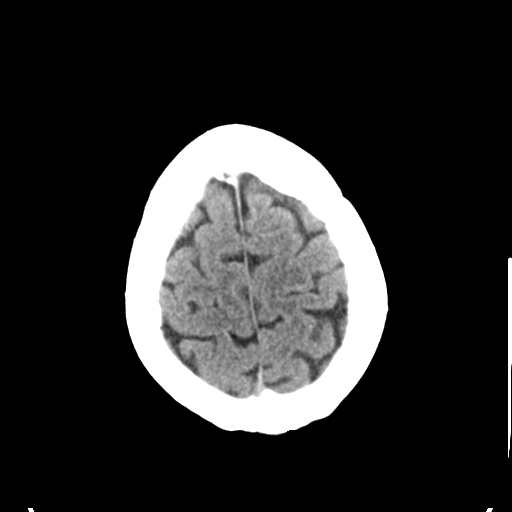
[im 27/33  brain]
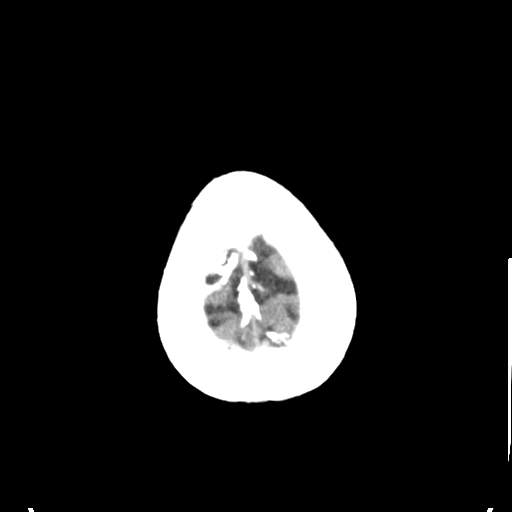
[im 30/33  brain]
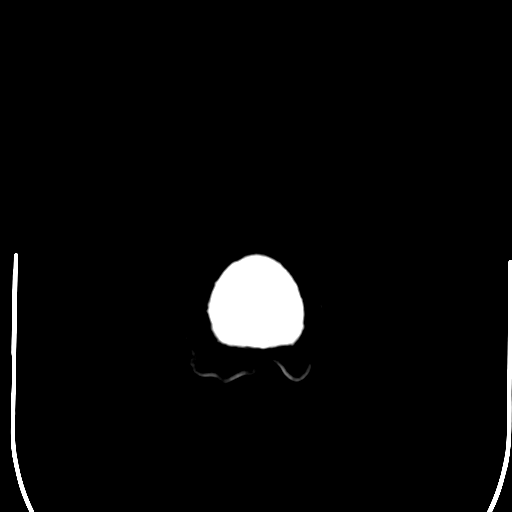
[im 30/33  bone]
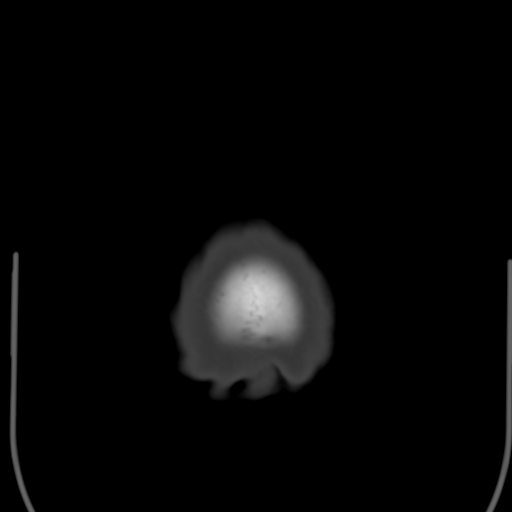

[Series 5: head 3.0 mpr cor · coronal · 0.32mm/px · 3 of 67 slices shown]
[im 23/67  brain]
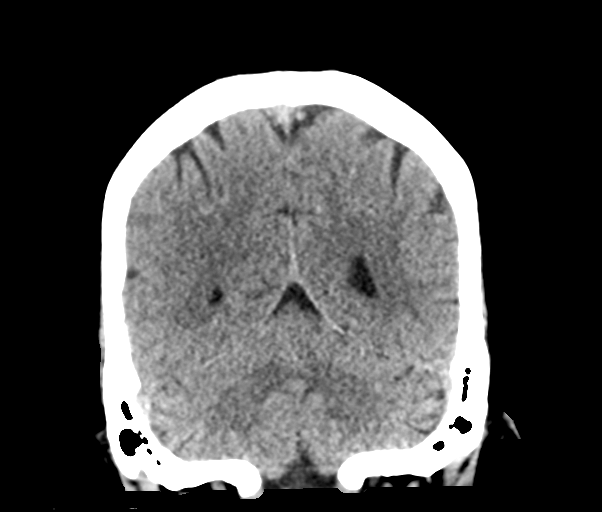
[im 30/67  brain]
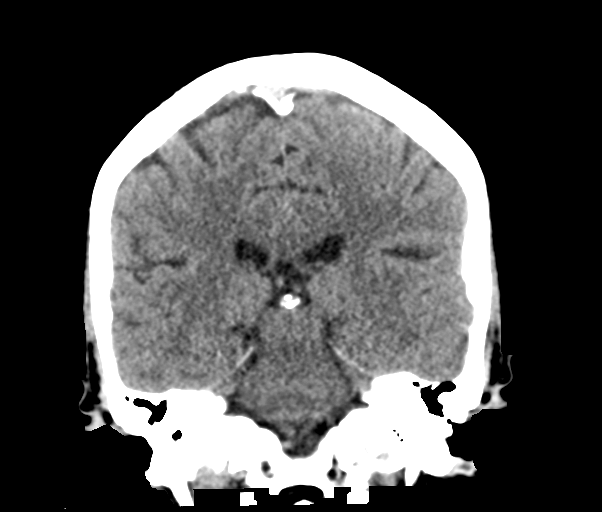
[im 37/67  brain]
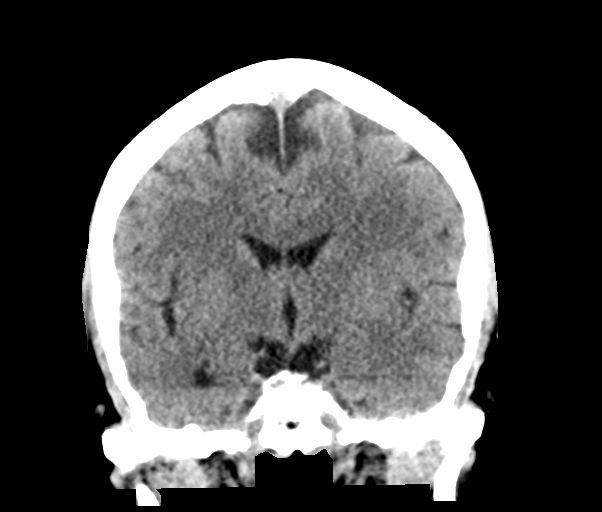

[Series 6: head 3.0 mpr sag · sagittal · 0.31mm/px · 3 of 67 slices shown]
[im 23/67  brain]
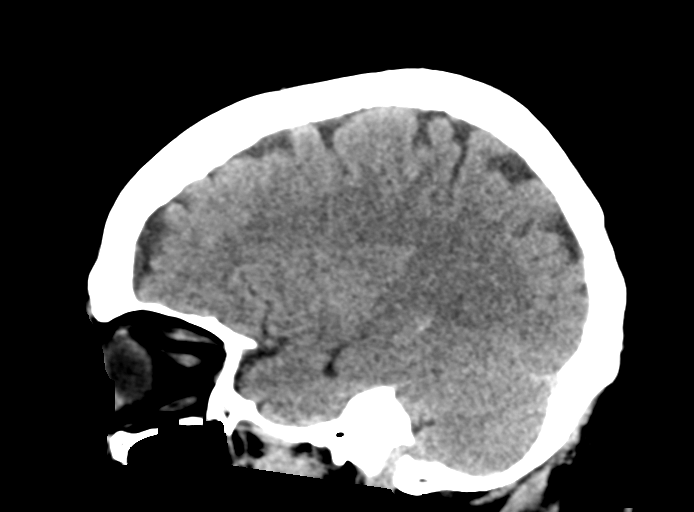
[im 34/67  brain]
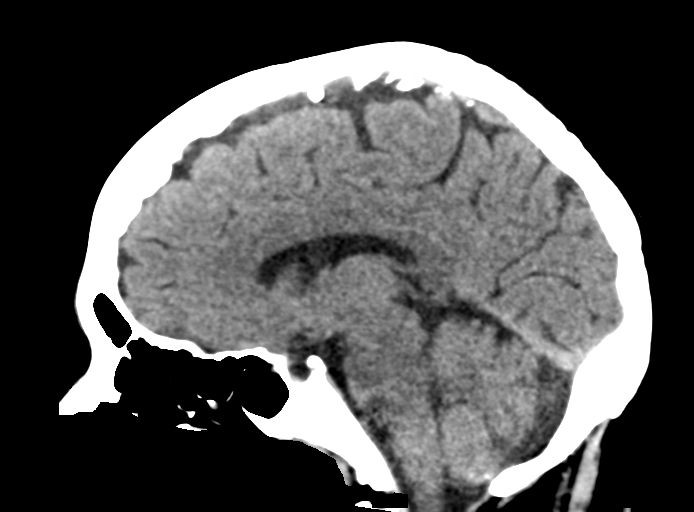
[im 45/67  brain]
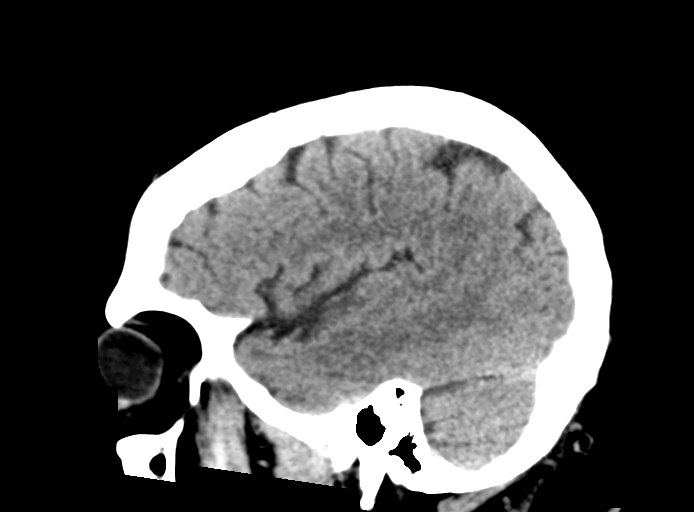

[15 of 47 positions shown; findings below may reference images not displayed]

FINDINGS: Brain: No evidence of acute infarction, hemorrhage, hydrocephalus,
extra-axial collection or mass lesion/mass effect.

Vascular: No hyperdense vessel or unexpected calcification.

Skull: Normal. Negative for fracture or focal lesion.

Sinuses/Orbits: Globes and orbits are unremarkable. The visualized
sinuses and mastoid air cells are clear.

Other: None.
IMPRESSION: Normal unenhanced CT scan of the brain.

## 2019-09-12 ENCOUNTER — Telehealth: Payer: Medicare Other | Admitting: Physician Assistant

## 2019-09-12 ENCOUNTER — Other Ambulatory Visit: Payer: Self-pay | Admitting: Internal Medicine

## 2019-09-12 DIAGNOSIS — I10 Essential (primary) hypertension: Secondary | ICD-10-CM

## 2019-09-12 DIAGNOSIS — E785 Hyperlipidemia, unspecified: Secondary | ICD-10-CM

## 2019-10-11 ENCOUNTER — Ambulatory Visit: Payer: Medicare Other | Admitting: Internal Medicine

## 2019-11-02 NOTE — Telephone Encounter (Signed)
error 

## 2019-12-11 ENCOUNTER — Other Ambulatory Visit: Payer: Self-pay | Admitting: Internal Medicine

## 2019-12-11 DIAGNOSIS — E785 Hyperlipidemia, unspecified: Secondary | ICD-10-CM

## 2019-12-11 DIAGNOSIS — I10 Essential (primary) hypertension: Secondary | ICD-10-CM

## 2019-12-11 NOTE — Telephone Encounter (Signed)
Requested Prescriptions  Pending Prescriptions Disp Refills   atorvastatin (LIPITOR) 10 MG tablet [Pharmacy Med Name: ATORVASTATIN 10MG  TABLETS] 90 tablet 0    Sig: TAKE 1 TABLET(10 MG) BY MOUTH DAILY     Cardiovascular:  Antilipid - Statins Failed - 12/11/2019  6:58 AM      Failed - LDL in normal range and within 360 days    LDL Chol Calc (NIH)  Date Value Ref Range Status  06/26/2019 88 0 - 99 mg/dL Final         Failed - HDL in normal range and within 360 days    HDL  Date Value Ref Range Status  06/26/2019 38 (L) >39 mg/dL Final         Passed - Total Cholesterol in normal range and within 360 days    Cholesterol, Total  Date Value Ref Range Status  06/26/2019 150 100 - 199 mg/dL Final         Passed - Triglycerides in normal range and within 360 days    Triglycerides  Date Value Ref Range Status  06/26/2019 136 0 - 149 mg/dL Final         Passed - Patient is not pregnant      Passed - Valid encounter within last 12 months    Recent Outpatient Visits          5 months ago Other chest pain   [place], [place], MD   9 months ago Essential hypertension   [place], [place], NP   1 year ago Essential hypertension   [place], MD   2 years ago [place], Deborah B, MD   2 years ago Migraine without aura and without status migrainosus, not intractable   [facility] [facility], MD

## 2019-12-13 ENCOUNTER — Other Ambulatory Visit: Payer: Self-pay | Admitting: Internal Medicine

## 2019-12-13 DIAGNOSIS — I10 Essential (primary) hypertension: Secondary | ICD-10-CM

## 2020-01-16 ENCOUNTER — Other Ambulatory Visit: Payer: Self-pay | Admitting: Internal Medicine

## 2020-01-16 DIAGNOSIS — I10 Essential (primary) hypertension: Secondary | ICD-10-CM

## 2020-06-02 ENCOUNTER — Other Ambulatory Visit: Payer: Self-pay | Admitting: Internal Medicine

## 2020-06-02 DIAGNOSIS — I10 Essential (primary) hypertension: Secondary | ICD-10-CM

## 2020-06-02 NOTE — Telephone Encounter (Signed)
Requested medications are due for refill today.  yes  Requested medications are on the active medications list.  yes  Last refill. 02/10/2019  Future visit scheduled.   no  Notes to clinic.  Pt already given a courtesy refill.

## 2021-05-12 ENCOUNTER — Encounter: Payer: Self-pay | Admitting: Gastroenterology

## 2021-06-10 ENCOUNTER — Ambulatory Visit (AMBULATORY_SURGERY_CENTER): Payer: Medicare Other

## 2021-06-10 VITALS — Ht 63.0 in | Wt 189.0 lb

## 2021-06-10 DIAGNOSIS — Z800 Family history of malignant neoplasm of digestive organs: Secondary | ICD-10-CM

## 2021-06-10 DIAGNOSIS — Z1211 Encounter for screening for malignant neoplasm of colon: Secondary | ICD-10-CM

## 2021-06-10 NOTE — Progress Notes (Signed)
No egg or soy allergy known to patient  ?No issues known to pt with past sedation with any surgeries or procedures ?Patient denies ever being told they had issues or difficulty with intubation  ?No FH of Malignant Hyperthermia ?Pt is not on diet pills ?Pt is not on home 02  ?Pt is not on blood thinners  ?Pt denies issues with constipation  ?No A fib or A flutter ?NO PA's for preps discussed with pt in PV today  ?Discussed with pt there will be an out-of-pocket cost for prep and that varies from $0 to 70 + dollars - pt verbalized understanding  ?Pt instructed to use Singlecare.com or GoodRx for a price reduction on prep  ?PV completed over the phone. Pt verified name, DOB, address and insurance during PV today.  ?Pt mailed instruction packet with copy of consent form to read and not return, and instructions.  ?Pt encouraged to call with questions or issues.  ?If pt has My chart, procedure instructions sent via My Chart  ?Insurance confirmed with pt at [facility] today - patient instructed to bring in insurance card to procedure; ? ?PV completed via phone interpreter ?

## 2021-06-19 ENCOUNTER — Other Ambulatory Visit: Payer: Self-pay | Admitting: Internal Medicine

## 2021-06-19 DIAGNOSIS — Z1231 Encounter for screening mammogram for malignant neoplasm of breast: Secondary | ICD-10-CM

## 2021-06-24 ENCOUNTER — Encounter: Payer: Self-pay | Admitting: Gastroenterology

## 2021-06-24 ENCOUNTER — Ambulatory Visit (AMBULATORY_SURGERY_CENTER): Payer: Medicare Other | Admitting: Gastroenterology

## 2021-06-24 VITALS — BP 108/83 | HR 72 | Temp 96.9°F | Resp 16 | Ht 63.0 in | Wt 189.0 lb

## 2021-06-24 DIAGNOSIS — Z1211 Encounter for screening for malignant neoplasm of colon: Secondary | ICD-10-CM

## 2021-06-24 DIAGNOSIS — Z800 Family history of malignant neoplasm of digestive organs: Secondary | ICD-10-CM

## 2021-06-24 DIAGNOSIS — D122 Benign neoplasm of ascending colon: Secondary | ICD-10-CM | POA: Diagnosis not present

## 2021-06-24 MED ORDER — SODIUM CHLORIDE 0.9 % IV SOLN: 500.0000 mL | Freq: Once | INTRAVENOUS | Status: DC

## 2021-06-24 NOTE — Patient Instructions (Signed)
Discharge instructions given. ?Handouts on polyps,diverticulosis and hemorrhoids. ?Resume previous medications. ?YOU HAD AN ENDOSCOPIC PROCEDURE TODAY AT [facility]:   Refer to the procedure report that was given to you for any specific questions about what was found during the examination.  If the procedure report does not answer your questions, please call your gastroenterologist to clarify.  If you requested that your care partner not be given the details of your procedure findings, then the procedure report has been included in a sealed envelope for you to review at your convenience later. ? ?YOU SHOULD EXPECT: Some feelings of bloating in the abdomen. Passage of more gas than usual.  Walking can help get rid of the air that was put into your GI tract during the procedure and reduce the bloating. If you had a lower endoscopy (such as a colonoscopy or flexible sigmoidoscopy) you may notice spotting of blood in your stool or on the toilet paper. If you underwent a bowel prep for your procedure, you may not have a normal bowel movement for a few days. ? ?Please Note:  You might notice some irritation and congestion in your nose or some drainage.  This is from the oxygen used during your procedure.  There is no need for concern and it should clear up in a day or so. ? ?SYMPTOMS TO REPORT IMMEDIATELY: ? ?Following lower endoscopy (colonoscopy or flexible sigmoidoscopy): ? Excessive amounts of blood in the stool ? Significant tenderness or worsening of abdominal pains ? Swelling of the abdomen that is new, acute ? Fever of 100?F or higher ? ? ?For urgent or emergent issues, a gastroenterologist can be reached at any hour by calling [phone]. ?Do not use MyChart messaging for urgent concerns.  ? ? ?DIET:  We do recommend a small meal at first, but then you may proceed to your regular diet.  Drink plenty of fluids but you should avoid alcoholic beverages for 24 hours. ? ?ACTIVITY:  You should  plan to take it easy for the rest of today and you should NOT DRIVE or use heavy machinery until tomorrow (because of the sedation medicines used during the test).   ? ?FOLLOW UP: ?Our staff will call the number listed on your records 48-72 hours following your procedure to check on you and address any questions or concerns that you may have regarding the information given to you following your procedure. If we do not reach you, we will leave a message.  We will attempt to reach you two times.  During this call, we will ask if you have developed any symptoms of COVID 19. If you develop any symptoms (ie: fever, flu-like symptoms, shortness of breath, cough etc.) before then, please call [phone].  If you test positive for Covid 19 in the 2 weeks post procedure, please call and report this information to [place].   ? ?If any biopsies were taken you will be contacted by phone or by letter within the next 1-3 weeks.  Please call us at [phone] if you have not heard about the biopsies in 3 weeks.  ? ? ?SIGNATURES/CONFIDENTIALITY: ?You and/or your care partner have signed paperwork which will be entered into your electronic medical record.  These signatures attest to the fact that that the information above on your After Visit Summary has been reviewed and is understood.  Full responsibility of the confidentiality of this discharge information lies with you and/or your care-partner.  ?

## 2021-06-24 NOTE — Op Note (Signed)
[facility] ?Patient Name: Shelly Meyer ?Procedure Date: 06/24/2021 10:06 AM ?MRN: [id number] ?Endoscopist: Milus Banister , MD ?Age: 71 ?Referring MD:  ?Date of Birth: 02-Sep-1950 ?Gender: Female ?Account #: [id number] ?Procedure:                Colonoscopy ?Indications:              Screening for colorectal malignant neoplasm; last  ?                          colonoscopy about 15 years ago elsewhere ?Medicines:                Monitored Anesthesia Care ?Procedure:                Pre-Anesthesia Assessment: ?                          - Prior to the procedure, a History and Physical  ?                          was performed, and patient medications and  ?                          allergies were reviewed. The patient's tolerance of  ?                          previous anesthesia was also reviewed. The risks  ?                          and benefits of the procedure and the sedation  ?                          options and risks were discussed with the patient.  ?                          All questions were answered, and informed consent  ?                          was obtained. Prior Anticoagulants: The patient has  ?                          taken no previous anticoagulant or antiplatelet  ?                          agents. ASA Grade Assessment: II - A patient with  ?                          mild systemic disease. After reviewing the risks  ?                          and benefits, the patient was deemed in  ?                          satisfactory condition to undergo the procedure. ?  After obtaining informed consent, the colonoscope  ?                          was passed under direct vision. Throughout the  ?                          procedure, the patient's blood pressure, pulse, and  ?                          oxygen saturations were monitored continuously. The  ?                          Olympus [id number]-[id number] (#[id number]) Colonoscope was  ?                          introduced through the  anus and advanced to the the  ?                          cecum, identified by appendiceal orifice and  ?                          ileocecal valve. The colonoscopy was performed  ?                          without difficulty. The patient tolerated the  ?                          procedure well. The quality of the bowel  ?                          preparation was good. The ileocecal valve,  ?                          appendiceal orifice, and rectum were photographed. ?Scope In: 10:19:20 AM ?Scope Out: 10:33:42 AM ?Scope Withdrawal Time: 0 hours 12 minutes 5 seconds  ?Total Procedure Duration: 0 hours 14 minutes 22 seconds  ?Findings:                 Two sessile polyps were found in the ascending  ?                          colon. The polyps were 2 to 5 mm in size. These  ?                          polyps were removed with a cold snare. Resection  ?                          and retrieval were complete. ?                          Multiple small and large-mouthed diverticula were  ?                          found in the left colon. ?  Internal hemorrhoids were found. The hemorrhoids  ?                          were small. ?                          The exam was otherwise without abnormality on  ?                          direct and retroflexion views. ?Complications:            No immediate complications. Estimated blood loss:  ?                          None. ?Estimated Blood Loss:     Estimated blood loss: none. ?Impression:               - Two 2 to 5 mm polyps in the ascending colon,  ?                          removed with a cold snare. Resected and retrieved. ?                          - Diverticulosis in the left colon. ?                          - Internal hemorrhoids. ?                          - The examination was otherwise normal on direct  ?                          and retroflexion views. ?Recommendation:           - Patient has a contact number available for  ?                           emergencies. The signs and symptoms of potential  ?                          delayed complications were discussed with the  ?                          patient. Return to normal activities tomorrow.  ?                          Written discharge instructions were provided to the  ?                          patient. ?                          - Resume previous diet. ?                          - Continue present medications. ?                          -  Await pathology results. ?Milus Banister, MD ?06/24/2021 10:37:21 AM ?This report has been signed electronically. ?

## 2021-06-24 NOTE — Progress Notes (Signed)
HPI: ?This is a woman with GM who had colon cancer (insignificant FH), so at routine risk for CRC ? ? ?ROS: complete GI ROS as described in HPI, all other review negative. ? ?Constitutional:  No unintentional weight loss ? ? ?Past Medical History:  ?Diagnosis Date  ? Carpal tunnel syndrome 02/28/2015  ? Cataract   ? LEFT eye sx completed/RIGHT eye to be scheduled (06/10/2021)  ? Depression 02/28/2015  ? Hypertension   ? on meds  ? Osteoarthritis   ? spine/bilateral feet/hands  ? Spinal stenosis of lumbar region 02/28/2015  ? Varicose veins of both lower extremities   ? ? ?Past Surgical History:  ?Procedure Laterality Date  ? CATARACT EXTRACTION Left   ? ENDOVENOUS ABLATION SAPHENOUS VEIN W/ LASER Right 05/14/2016  ? endovenous laser ablation right greater saphenous vein by Tinnie Gens MD   ? ? ?Current Outpatient Medications  ?Medication Sig Dispense Refill  ? Cholecalciferol (VITAMIN D3 PO) Take 1 tablet by mouth daily at 6 (six) AM.    ? lisinopril (ZESTRIL) 20 MG tablet TAKE 1 TABLET(20 MG) BY MOUTH DAILY 30 tablet 0  ? Multiple Vitamins-Minerals (MULTIVITAMIN WOMEN 50+ PO) Take 2 tablets by mouth daily at 6 (six) AM. GUMMY    ? ?Current Facility-Administered Medications  ?Medication Dose Route Frequency Provider Last Rate Last Admin  ? 0.9 %  sodium chloride infusion  500 mL Intravenous Once Milus Banister, MD      ? ? ?Allergies as of 06/24/2021  ? (No Known Allergies)  ? ? ?Family History  ?Problem Relation Age of Onset  ? Stroke Mother   ? Colon polyps Maternal Grandmother   ? Colon cancer Maternal Grandmother 62  ? Esophageal cancer Neg Hx   ? Rectal cancer Neg Hx   ? Stomach cancer Neg Hx   ? ? ?Social History  ? ?Socioeconomic History  ? Marital status: Divorced  ?  Spouse name: Not on file  ? Number of children: Not on file  ? Years of education: Not on file  ? Highest education level: Not on file  ?Occupational History  ? Not on file  ?Tobacco Use  ? Smoking status: Never  ? Smokeless tobacco: Never   ?Vaping Use  ? Vaping Use: Never used  ?Substance and Sexual Activity  ? Alcohol use: No  ? Drug use: No  ? Sexual activity: Not on file  ?Other Topics Concern  ? Not on file  ?Social History Narrative  ? Not on file  ? ?Social Determinants of Health  ? ?Financial Resource Strain: Not on file  ?Food Insecurity: Not on file  ?Transportation Needs: Not on file  ?Physical Activity: Not on file  ?Stress: Not on file  ?Social Connections: Not on file  ?Intimate Partner Violence: Not on file  ? ? ? ?Physical Exam: ?BP 126/60   Pulse 83   Temp (!) 96.9 ?F (36.1 ?C)   Ht '5\' 3"'$  (1.6 m)   Wt 189 lb (85.7 kg)   LMP  (LMP Unknown)   SpO2 97%   BMI 33.48 kg/m?  ?Constitutional: generally well-appearing ?Psychiatric: alert and oriented x3 ?Lungs: CTA bilaterally ?Heart: no MCR ? ?Assessment and plan: ?71 y.o. female with routine risk for CRC ? ?[place] [facility] today ? ?Care is appropriate for the ambulatory setting. ? ?Owens Loffler, MD ?[facility] ?06/24/2021, 10:00 AM ? ? ? ?

## 2021-06-24 NOTE — Progress Notes (Signed)
Sedate, gd SR, tolerated procedure well, VSS, report to RN 

## 2021-06-24 NOTE — Progress Notes (Signed)
Pt's states no medical or surgical changes since previsit or office visit. ? ? ?All questions asked and answered via interpreter  ?

## 2021-06-24 NOTE — Progress Notes (Signed)
Called to room to assist during endoscopic procedure.  Patient ID and intended procedure confirmed with present staff. Received instructions for my participation in the procedure from the performing physician.  

## 2021-06-30 ENCOUNTER — Encounter: Payer: Self-pay | Admitting: Gastroenterology

## 2021-07-01 ENCOUNTER — Ambulatory Visit
Admission: RE | Admit: 2021-07-01 | Discharge: 2021-07-01 | Disposition: A | Discharge status: Home / Self Care | Payer: Medicare Other | Source: Ambulatory Visit | Attending: Internal Medicine | Admitting: Internal Medicine

## 2021-07-01 DIAGNOSIS — Z1231 Encounter for screening mammogram for malignant neoplasm of breast: Secondary | ICD-10-CM

## 2021-10-16 ENCOUNTER — Ambulatory Visit
Admission: RE | Admit: 2021-10-16 | Discharge: 2021-10-16 | Disposition: A | Discharge status: Home / Self Care | Payer: Medicare Other | Source: Ambulatory Visit | Attending: Infectious Diseases | Admitting: Infectious Diseases

## 2021-10-16 ENCOUNTER — Other Ambulatory Visit: Payer: Self-pay | Admitting: Obstetrics

## 2021-10-16 ENCOUNTER — Other Ambulatory Visit: Payer: Self-pay | Admitting: Infectious Diseases

## 2021-10-16 DIAGNOSIS — M25511 Pain in right shoulder: Secondary | ICD-10-CM

## 2021-11-16 DIAGNOSIS — M545 Low back pain, unspecified: Secondary | ICD-10-CM | POA: Diagnosis not present

## 2021-11-16 DIAGNOSIS — M25511 Pain in right shoulder: Secondary | ICD-10-CM | POA: Diagnosis not present

## 2021-11-16 DIAGNOSIS — M6281 Muscle weakness (generalized): Secondary | ICD-10-CM | POA: Diagnosis not present

## 2021-11-19 DIAGNOSIS — G4733 Obstructive sleep apnea (adult) (pediatric): Secondary | ICD-10-CM | POA: Diagnosis not present

## 2021-11-20 DIAGNOSIS — G4733 Obstructive sleep apnea (adult) (pediatric): Secondary | ICD-10-CM | POA: Diagnosis not present

## 2021-12-09 DIAGNOSIS — M25511 Pain in right shoulder: Secondary | ICD-10-CM | POA: Diagnosis not present

## 2021-12-09 DIAGNOSIS — M545 Low back pain, unspecified: Secondary | ICD-10-CM | POA: Diagnosis not present

## 2021-12-09 DIAGNOSIS — G4733 Obstructive sleep apnea (adult) (pediatric): Secondary | ICD-10-CM | POA: Diagnosis not present

## 2021-12-09 DIAGNOSIS — M6281 Muscle weakness (generalized): Secondary | ICD-10-CM | POA: Diagnosis not present

## 2021-12-11 DIAGNOSIS — M545 Low back pain, unspecified: Secondary | ICD-10-CM | POA: Diagnosis not present

## 2021-12-11 DIAGNOSIS — M6281 Muscle weakness (generalized): Secondary | ICD-10-CM | POA: Diagnosis not present

## 2021-12-11 DIAGNOSIS — M25511 Pain in right shoulder: Secondary | ICD-10-CM | POA: Diagnosis not present

## 2021-12-16 DIAGNOSIS — M545 Low back pain, unspecified: Secondary | ICD-10-CM | POA: Diagnosis not present

## 2021-12-16 DIAGNOSIS — M6281 Muscle weakness (generalized): Secondary | ICD-10-CM | POA: Diagnosis not present

## 2021-12-16 DIAGNOSIS — M25511 Pain in right shoulder: Secondary | ICD-10-CM | POA: Diagnosis not present

## 2021-12-18 DIAGNOSIS — M545 Low back pain, unspecified: Secondary | ICD-10-CM | POA: Diagnosis not present

## 2021-12-18 DIAGNOSIS — M25511 Pain in right shoulder: Secondary | ICD-10-CM | POA: Diagnosis not present

## 2021-12-18 DIAGNOSIS — M6281 Muscle weakness (generalized): Secondary | ICD-10-CM | POA: Diagnosis not present

## 2021-12-23 DIAGNOSIS — M6281 Muscle weakness (generalized): Secondary | ICD-10-CM | POA: Diagnosis not present

## 2021-12-23 DIAGNOSIS — M545 Low back pain, unspecified: Secondary | ICD-10-CM | POA: Diagnosis not present

## 2021-12-23 DIAGNOSIS — M25511 Pain in right shoulder: Secondary | ICD-10-CM | POA: Diagnosis not present

## 2021-12-25 DIAGNOSIS — M25511 Pain in right shoulder: Secondary | ICD-10-CM | POA: Diagnosis not present

## 2021-12-25 DIAGNOSIS — M545 Low back pain, unspecified: Secondary | ICD-10-CM | POA: Diagnosis not present

## 2021-12-25 DIAGNOSIS — M6281 Muscle weakness (generalized): Secondary | ICD-10-CM | POA: Diagnosis not present

## 2021-12-30 DIAGNOSIS — M6281 Muscle weakness (generalized): Secondary | ICD-10-CM | POA: Diagnosis not present

## 2021-12-30 DIAGNOSIS — M25511 Pain in right shoulder: Secondary | ICD-10-CM | POA: Diagnosis not present

## 2021-12-30 DIAGNOSIS — M545 Low back pain, unspecified: Secondary | ICD-10-CM | POA: Diagnosis not present

## 2022-01-06 DIAGNOSIS — M6281 Muscle weakness (generalized): Secondary | ICD-10-CM | POA: Diagnosis not present

## 2022-01-06 DIAGNOSIS — M545 Low back pain, unspecified: Secondary | ICD-10-CM | POA: Diagnosis not present

## 2022-01-06 DIAGNOSIS — M25511 Pain in right shoulder: Secondary | ICD-10-CM | POA: Diagnosis not present

## 2022-01-19 DIAGNOSIS — G4733 Obstructive sleep apnea (adult) (pediatric): Secondary | ICD-10-CM | POA: Diagnosis not present

## 2022-01-20 DIAGNOSIS — G4733 Obstructive sleep apnea (adult) (pediatric): Secondary | ICD-10-CM | POA: Diagnosis not present

## 2022-02-20 DIAGNOSIS — G4733 Obstructive sleep apnea (adult) (pediatric): Secondary | ICD-10-CM | POA: Diagnosis not present

## 2022-03-23 DIAGNOSIS — G4733 Obstructive sleep apnea (adult) (pediatric): Secondary | ICD-10-CM | POA: Diagnosis not present

## 2022-04-21 DIAGNOSIS — G4733 Obstructive sleep apnea (adult) (pediatric): Secondary | ICD-10-CM | POA: Diagnosis not present

## 2022-04-27 DIAGNOSIS — M5416 Radiculopathy, lumbar region: Secondary | ICD-10-CM | POA: Diagnosis not present

## 2022-04-27 DIAGNOSIS — E538 Deficiency of other specified B group vitamins: Secondary | ICD-10-CM | POA: Diagnosis not present

## 2022-04-27 DIAGNOSIS — I700 Atherosclerosis of aorta: Secondary | ICD-10-CM | POA: Diagnosis not present

## 2022-04-27 DIAGNOSIS — Z0289 Encounter for other administrative examinations: Secondary | ICD-10-CM | POA: Diagnosis not present

## 2022-04-27 DIAGNOSIS — E559 Vitamin D deficiency, unspecified: Secondary | ICD-10-CM | POA: Diagnosis not present

## 2022-04-27 DIAGNOSIS — E785 Hyperlipidemia, unspecified: Secondary | ICD-10-CM | POA: Diagnosis not present

## 2022-04-27 DIAGNOSIS — I10 Essential (primary) hypertension: Secondary | ICD-10-CM | POA: Diagnosis not present

## 2022-04-27 DIAGNOSIS — R7303 Prediabetes: Secondary | ICD-10-CM | POA: Diagnosis not present

## 2022-04-27 DIAGNOSIS — R42 Dizziness and giddiness: Secondary | ICD-10-CM | POA: Diagnosis not present

## 2022-04-27 DIAGNOSIS — G63 Polyneuropathy in diseases classified elsewhere: Secondary | ICD-10-CM | POA: Diagnosis not present

## 2022-05-04 DIAGNOSIS — D692 Other nonthrombocytopenic purpura: Secondary | ICD-10-CM | POA: Diagnosis not present

## 2022-05-04 DIAGNOSIS — I10 Essential (primary) hypertension: Secondary | ICD-10-CM | POA: Diagnosis not present

## 2022-05-04 DIAGNOSIS — L309 Dermatitis, unspecified: Secondary | ICD-10-CM | POA: Diagnosis not present

## 2022-05-04 DIAGNOSIS — Z0289 Encounter for other administrative examinations: Secondary | ICD-10-CM | POA: Diagnosis not present

## 2022-05-04 DIAGNOSIS — R7303 Prediabetes: Secondary | ICD-10-CM | POA: Diagnosis not present

## 2022-05-04 DIAGNOSIS — E785 Hyperlipidemia, unspecified: Secondary | ICD-10-CM | POA: Diagnosis not present

## 2022-05-11 DIAGNOSIS — Z0289 Encounter for other administrative examinations: Secondary | ICD-10-CM | POA: Diagnosis not present

## 2022-05-11 DIAGNOSIS — M542 Cervicalgia: Secondary | ICD-10-CM | POA: Diagnosis not present

## 2022-05-12 DIAGNOSIS — M19011 Primary osteoarthritis, right shoulder: Secondary | ICD-10-CM | POA: Diagnosis not present

## 2022-05-16 ENCOUNTER — Emergency Department (HOSPITAL_COMMUNITY): Payer: Medicare Other

## 2022-05-16 ENCOUNTER — Emergency Department (HOSPITAL_COMMUNITY)
Admission: EM | Admit: 2022-05-16 | Discharge: 2022-05-16 | Disposition: A | Discharge status: Home / Self Care | Payer: Medicare Other | Attending: Emergency Medicine | Admitting: Emergency Medicine

## 2022-05-16 ENCOUNTER — Other Ambulatory Visit: Payer: Self-pay

## 2022-05-16 DIAGNOSIS — M4312 Spondylolisthesis, cervical region: Secondary | ICD-10-CM | POA: Diagnosis not present

## 2022-05-16 DIAGNOSIS — R079 Chest pain, unspecified: Secondary | ICD-10-CM | POA: Diagnosis not present

## 2022-05-16 DIAGNOSIS — Z79899 Other long term (current) drug therapy: Secondary | ICD-10-CM | POA: Insufficient documentation

## 2022-05-16 DIAGNOSIS — M5412 Radiculopathy, cervical region: Secondary | ICD-10-CM | POA: Insufficient documentation

## 2022-05-16 DIAGNOSIS — I10 Essential (primary) hypertension: Secondary | ICD-10-CM | POA: Insufficient documentation

## 2022-05-16 DIAGNOSIS — M542 Cervicalgia: Secondary | ICD-10-CM | POA: Diagnosis not present

## 2022-05-16 LAB — BASIC METABOLIC PANEL
Anion gap: 8 (ref 5–15)
BUN: 17 mg/dL (ref 8–23)
CO2: 24 mmol/L (ref 22–32)
Calcium: 8.8 mg/dL — ABNORMAL LOW (ref 8.9–10.3)
Chloride: 105 mmol/L (ref 98–111)
Creatinine, Ser: 0.42 mg/dL — ABNORMAL LOW (ref 0.44–1.00)
GFR, Estimated: >60 mL/min (ref >60)
Glucose, Bld: 114 mg/dL — ABNORMAL HIGH (ref 70–99)
Potassium: 3.6 mmol/L (ref 3.5–5.1)
Sodium: 137 mmol/L (ref 135–145)

## 2022-05-16 LAB — TROPONIN I (HIGH SENSITIVITY)
Troponin I (High Sensitivity): 3 ng/L (ref <18)
Troponin I (High Sensitivity): 4 ng/L (ref <18)

## 2022-05-16 LAB — CBC
HCT: 43 % (ref 36.0–46.0)
Hemoglobin: 14.5 g/dL (ref 12.0–15.0)
MCH: 30.1 pg (ref 26.0–34.0)
MCHC: 33.7 g/dL (ref 30.0–36.0)
MCV: 89.2 fL (ref 80.0–100.0)
Platelets: 269 10*3/uL (ref 150–400)
RBC: 4.82 MIL/uL (ref 3.87–5.11)
RDW: 12.6 % (ref 11.5–15.5)
WBC: 7.5 10*3/uL (ref 4.0–10.5)
nRBC: 0 % (ref 0.0–0.2)

## 2022-05-16 LAB — CK: Total CK: 161 U/L (ref 38–234)

## 2022-05-16 MED ORDER — HYDROCODONE-ACETAMINOPHEN 5-325 MG PO TABS
1.0000 | ORAL_TABLET | Freq: Once | ORAL | Status: AC
  Administered 2022-05-16: 1 via ORAL
  Filled 2022-05-16: qty 1

## 2022-05-16 MED ORDER — KETOROLAC TROMETHAMINE 30 MG/ML IJ SOLN
30.0000 mg | Freq: Once | INTRAMUSCULAR | Status: AC
  Administered 2022-05-16: 30 mg via INTRAMUSCULAR
  Filled 2022-05-16: qty 1

## 2022-05-16 MED ORDER — PREDNISONE 10 MG (21) PO TBPK: ORAL_TABLET | Freq: Every day | ORAL | 0 refills | Status: DC

## 2022-05-16 MED ORDER — OXYCODONE HCL 5 MG PO TABS: 5.0000 mg | ORAL_TABLET | Freq: Four times a day (QID) | ORAL | 0 refills | Status: DC | PRN

## 2022-05-16 MED ORDER — IOHEXOL 350 MG/ML SOLN
75.0000 mL | Freq: Once | INTRAVENOUS | Status: AC | PRN
  Administered 2022-05-16: 75 mL via INTRAVENOUS

## 2022-05-16 NOTE — Discharge Instructions (Addendum)
Note the workup today was overall reassuring.  CT imaging showed narrowing of your cervical spine which could be causing your symptoms in addition to muscular spasm.  As discussed, we will send in prednisone taper to help with pain/inflammation.  Will also send in short course of pain medicine to use as needed.  Will attach information to your discharge papers to call to follow-up with the spinal specialist regarding CT imaging findings.  Please do not hesitate to return to emergency department for worrisome signs and symptoms we discussed during repair.

## 2022-05-16 NOTE — ED Triage Notes (Signed)
Pt arrived via POV. Pt c/p neck, and right shoulder and arm pain for almost 2x weeks. Pt has been to their PCP about the problem and has had x-rays done.  AOx4

## 2022-05-16 NOTE — ED Provider Notes (Signed)
[place] [facility] AT [facility] Provider Note   CSN: [id number] Arrival date & time: 05/16/22  9528     History  Chief Complaint  Patient presents with   Neck Pain   Shoulder Pain    Shelly Meyer is a 72 y.o. female.   Neck Pain Shoulder Pain Associated symptoms: neck pain     72 year old female presents emergency department with complaints of right-sided neck, back and arm pain.  Patient reports symptoms present for the past 1 to 2 weeks.  Was seen by primary care a few days ago with negative x-ray imaging of the cervical spine, shoulder at that time.  She was given muscle relaxers, naproxen and 2 injections while in the office of which patient states has not helped her symptoms at all.  Denies any falls/traumas.  States that pain initially began after awakening.  States pain is worse with movement.  Reports baseline numbness in bilateral hands along median nerve distribution secondary to bilateral carpal tunnel.  Denies any changes in sensation from baseline or appreciable weakness.  Denies fever, chills chest pain, shortness of breath, abdominal pain, nausea, vomiting, urinary symptoms, change in bowel habits.   Past medical history significant for carpal tunnel syndrome, hypertension, osteoarthritis, spinal stenosis of lumbar region  Home Medications Prior to Admission medications   Medication Sig Start Date End Date Taking? Authorizing Provider  oxyCODONE (ROXICODONE) 5 MG immediate release tablet Take 1 tablet (5 mg total) by mouth every 6 (six) hours as needed for severe pain. 05/16/22  Yes Sherian Maroon A, PA  predniSONE (STERAPRED UNI-PAK 21 TAB) 10 MG (21) TBPK tablet Take by mouth daily. Take 6 tabs by mouth daily  for 2 days, then 5 tabs for 2 days, then 4 tabs for 2 days, then 3 tabs for 2 days, 2 tabs for 2 days, then 1 tab by mouth daily for 2 days 05/16/22  Yes Sherian Maroon A, PA  Cholecalciferol (VITAMIN D3 PO) Take 1 tablet by mouth daily at  6 (six) AM.    [provider]  lisinopril (ZESTRIL) 20 MG tablet TAKE 1 TABLET(20 MG) BY MOUTH DAILY 12/11/19   Marcine Matar, MD  Multiple Vitamins-Minerals (MULTIVITAMIN WOMEN 50+ PO) Take 2 tablets by mouth daily at 6 (six) AM. GUMMY    [provider]      Allergies    Patient has no known allergies.    Review of Systems   Review of Systems  Musculoskeletal:  Positive for neck pain.  All other systems reviewed and are negative.   Physical Exam Updated Vital Signs BP (!) 165/89   Pulse 74   Temp 97.8 F (36.6 C) (Oral)   Resp 16   LMP  (LMP Unknown)   SpO2 98%  Physical Exam Vitals and nursing note reviewed.  Constitutional:      General: She is not in acute distress.    Appearance: She is well-developed.  HENT:     Head: Normocephalic and atraumatic.  Eyes:     Conjunctiva/sclera: Conjunctivae normal.  Cardiovascular:     Rate and Rhythm: Normal rate and regular rhythm.  Pulmonary:     Effort: Pulmonary effort is normal. No respiratory distress.     Breath sounds: Normal breath sounds. No wheezing, rhonchi or rales.  Abdominal:     Palpations: Abdomen is soft.     Tenderness: There is no abdominal tenderness.  Musculoskeletal:        General: No swelling.  Cervical back: Neck supple.     Comments: Patient with mild lower midline cervical spine tenderness to palpation.  No midline tenderness of thoracic or lumbar spine with no obvious step-off or deformity appreciated entirety of the spine.  Patient with diffuse paraspinal tenderness noted right cervical region as well as right upper thoracic region.  Tender to palpation of right trapezial ridge with extension into right shoulder.  Patient has full range of motion of bilateral shoulders, elbows, wrist, digits but with pain of movement of right upper extremity.  Radial pulses 2+ bilaterally.  Patient with baseline sensation along distribution of median nerve bilaterally otherwise, without sensory  deficits.  Muscular strength symmetric bilaterally.  No overlying skin abnormalities appreciated.  Skin:    General: Skin is warm and dry.     Capillary Refill: Capillary refill takes less than 2 seconds.  Neurological:     Mental Status: She is alert.  Psychiatric:        Mood and Affect: Mood normal.     ED Results / Procedures / Treatments   Labs (all labs ordered are listed, but only abnormal results are displayed) Labs Reviewed  BASIC METABOLIC PANEL - Abnormal; Notable for the following components:      Result Value   Glucose, Bld 114 (*)    Creatinine, Ser 0.42 (*)    Calcium 8.8 (*)    All other components within normal limits  CBC  CK  TROPONIN I (HIGH SENSITIVITY)  TROPONIN I (HIGH SENSITIVITY)    EKG None  Radiology CT Cervical Spine Wo Contrast  Result Date: 05/16/2022 CLINICAL DATA:  Neck pain, shoulder pain EXAM: CT CERVICAL SPINE WITHOUT CONTRAST TECHNIQUE: Multidetector CT imaging of the cervical spine was performed without intravenous contrast. Multiplanar CT image reconstructions were also generated. RADIATION DOSE REDUCTION: This exam was performed according to the departmental dose-optimization program which includes automated exposure control, adjustment of the mA and/or kV according to patient size and/or use of iterative reconstruction technique. COMPARISON:  02/08/2009 FINDINGS: Alignment: There is minimal retrolisthesis at C4-C5 level. There may be residual from previous ligament injury and facet degeneration. There is minimal levoscoliosis. Skull base and vertebrae: No recent fracture is seen. Degenerative changes are noted at multiple levels, more so at C4-C5 level with interval worsening. Soft tissues and spinal canal: Posterior bony spurs are causing extrinsic pressure over the ventral margin of thecal sac at multiple levels, more so at C4-C5 level. There is mild spinal stenosis at C4-C5 level. Disc levels: There is moderate to marked encroachment of  neural foramina by bony spurs and facet hypertrophy at C4-C5 level. There is moderate encroachment of neural foramina at C5-C6 level. There is moderate to marked encroachment of neural foramina at C6-C7 level, more so on the left side. Upper chest: Small portions of apices of both lungs are included in the study. Other: None. IMPRESSION: No recent fracture is seen in cervical spine. There is mild spinal stenosis caused by posterior bony spurs at C4-C5 level. There is encroachment of neural foramina from C4-C7 levels, more so at C4-C5 and C6-C7 levels. Electronically Signed   By: Ernie Avena M.D.   On: 05/16/2022 15:02   CT Angio Chest Aorta W and/or Wo Contrast  Result Date: 05/16/2022 CLINICAL DATA:  Chest pain, shoulder pain, clinical suspicion for acute aortic syndrome EXAM: CT ANGIOGRAPHY CHEST WITH CONTRAST TECHNIQUE: Multidetector CT imaging of the chest was performed using the standard protocol during bolus administration of intravenous contrast. Multiplanar CT image reconstructions  and MIPs were obtained to evaluate the vascular anatomy. RADIATION DOSE REDUCTION: This exam was performed according to the departmental dose-optimization program which includes automated exposure control, adjustment of the mA and/or kV according to patient size and/or use of iterative reconstruction technique. CONTRAST:  75mL OMNIPAQUE IOHEXOL 350 MG/ML SOLN COMPARISON:  Chest radiograph done on 06/13/2017 FINDINGS: Cardiovascular: Noncontrast images are not available. There is homogeneous enhancement in thoracic aortic lumen. There are scattered atherosclerotic plaques and calcifications in thoracic aorta. Major branches of the thoracic aorta appear patent. There are no filling defects in central pulmonary artery branches. Motion artifacts limit evaluation of segmental and subsegmental pulmonary artery branches. Mediastinum/Nodes: There are small calcified nodes in mediastinum. Lungs/Pleura: There is no focal  pulmonary consolidation. There are patchy ground-glass densities in mid and lower lung fields. There is no pleural effusion or pneumothorax. Upper Abdomen: There is fatty infiltration in liver. Musculoskeletal: No acute findings are seen. Review of the MIP images confirms the above findings. IMPRESSION: There is no evidence of thoracic aortic dissection. Major branches of thoracic aorta appear patent. There is no evidence of pulmonary embolism in the central pulmonary artery branches. Segmental and subsegmental pulmonary artery branches are not adequately visualized for evaluation. There are patchy ground-glass densities in both lungs suggesting scarring or interstitial pneumonia. There is no focal pulmonary consolidation. There is no pleural effusion or pneumothorax. Fatty liver. Electronically Signed   By: Ernie Avena M.D.   On: 05/16/2022 14:51    Procedures Procedures    Medications Ordered in ED Medications  ketorolac (TORADOL) 30 MG/ML injection 30 mg (30 mg Intramuscular Given 05/16/22 1053)  HYDROcodone-acetaminophen (NORCO/VICODIN) 5-325 MG per tablet 1 tablet (1 tablet Oral Given 05/16/22 1053)  iohexol (OMNIPAQUE) 350 MG/ML injection 75 mL (75 mLs Intravenous Contrast Given 05/16/22 1428)  HYDROcodone-acetaminophen (NORCO/VICODIN) 5-325 MG per tablet 1 tablet (1 tablet Oral Given 05/16/22 1548)    ED Course/ Medical Decision Making/ A&P Clinical Course as of 05/16/22 1944  Sun May 16, 2022  1010 1 week [CR]    Clinical Course User Index [CR] Peter Garter, PA                             Medical Decision Making Amount and/or Complexity of Data Reviewed Labs: ordered. Radiology: ordered.  Risk Prescription drug management.   This patient presents to the ED for concern of neck/arm pain, this involves an extensive number of treatment options, and is a complaint that carries with it a high risk of complications and morbidity.  The differential diagnosis includes thoracic  outlet syndrome, fracture, dislocation, strain/sprain, cervical radiculopathy, ischemic limb, DVT   Co morbidities that complicate the patient evaluation  See HPI   Additional history obtained:  Additional history obtained from EMR External records from outside source obtained and reviewed including hospital records   Lab Tests:  I Ordered, and personally interpreted labs.  The pertinent results include: No leukocytosis noted.  No evidence of anemia.  (Normal range.  Mild hypocalcemia of 8.8 otherwise, electrolytes within normal limits.  No renal dysfunction.  CK within normal limits.  Initial troponin of 4.   Imaging Studies ordered:  I ordered imaging studies including CT angio chest, CT cervical spine I independently visualized and interpreted imaging which showed  CT angio chest: No evidence of thoracic aortic dissection or dissection of major branches of thoracic aorta.  No evidence of PE.  Groundglass densities in both lungs.  No focal pulmonary consolidation.  Fatty liver. CT cervical spine: No recent fracture is seen in cervical spine.  Mild spinal stenosis by bony spurs at C4-C5.  Encroachment of the neural foramina at C4-C7 worsened with C4-5 and C6-C7 I agree with the radiologist interpretation  Cardiac Monitoring: / EKG:  The patient was maintained on a cardiac monitor.  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  No obvious acute ischemic changes.   Consultations Obtained:  I requested consultation with attending physician Dr. Estell Harpin who is in agreement with treatment plan going forward.   Problem List / ED Course / Critical interventions / Medication management  Cervical radiculopathy I ordered medication including Norco, Toradol  Reevaluation of the patient after  these medicines showed that the patient improved I have reviewed the patients home medicines and have made adjustments as needed   Social Determinants of Health:  Denies tobacco, illicit drug use   Test / Admission - Considered:  Cervical radiculopathy Vitals signs significant for patient with an initial hypertension of 190/82 oh which decreased with time elapsed and medicines administered on emergency department 165/89. Otherwise within normal range and stable throughout visit. Laboratory/imaging studies significant for: See above 72 year old female presents with symptoms consistent with cervical radiculopathy.  CTA imaging of the chest was ordered of which was without abnormality; there is some concern for possible dissection given patient's hypertension associated with prolonged pain not relieved by muscle laxer's and nonsteroidals.  CT imaging of the C-spine was ordered which showed degenerative changes as depicted above most likely causing patient's cervical radiculopathy.  Patient's main symptom is pain without acute weakness or acute sensory deficits in right upper extremity.  Will plan to discharge home with prednisone taper we will attach neurosurgery information for follow-up outpatient.  Considered more advanced imaging via MRI when deemed necessary due to patient without weakness or acute sensory deficit.  Treatment plan discussed at length with patient and she acknowledged understanding was agreeable to said plan. Worrisome signs and symptoms were discussed with the patient, and the patient acknowledged understanding to return to the ED if noticed. Patient was stable upon discharge.          Final Clinical Impression(s) / ED Diagnoses Final diagnoses:  Cervical radiculopathy    Rx / DC Orders ED Discharge Orders          Ordered    predniSONE (STERAPRED UNI-PAK 21 TAB) 10 MG (21) TBPK tablet  Daily        05/16/22 1517    oxyCODONE (ROXICODONE) 5 MG immediate release  tablet  Every 6 hours PRN        05/16/22 1517              Peter Garter, [place] 05/16/22 1944    Bethann Berkshire, MD 05/19/22 [phone]

## 2022-05-19 DIAGNOSIS — Z09 Encounter for follow-up examination after completed treatment for conditions other than malignant neoplasm: Secondary | ICD-10-CM | POA: Diagnosis not present

## 2022-05-19 DIAGNOSIS — M5412 Radiculopathy, cervical region: Secondary | ICD-10-CM | POA: Diagnosis not present

## 2022-05-19 DIAGNOSIS — Z0289 Encounter for other administrative examinations: Secondary | ICD-10-CM | POA: Diagnosis not present

## 2022-05-22 DIAGNOSIS — G4733 Obstructive sleep apnea (adult) (pediatric): Secondary | ICD-10-CM | POA: Diagnosis not present

## 2022-05-24 DIAGNOSIS — M47812 Spondylosis without myelopathy or radiculopathy, cervical region: Secondary | ICD-10-CM | POA: Diagnosis not present

## 2022-06-03 DIAGNOSIS — Z0289 Encounter for other administrative examinations: Secondary | ICD-10-CM | POA: Diagnosis not present

## 2022-06-03 DIAGNOSIS — J301 Allergic rhinitis due to pollen: Secondary | ICD-10-CM | POA: Diagnosis not present

## 2022-06-03 DIAGNOSIS — B351 Tinea unguium: Secondary | ICD-10-CM | POA: Diagnosis not present

## 2022-06-03 DIAGNOSIS — H539 Unspecified visual disturbance: Secondary | ICD-10-CM | POA: Diagnosis not present

## 2022-06-03 DIAGNOSIS — Z1231 Encounter for screening mammogram for malignant neoplasm of breast: Secondary | ICD-10-CM | POA: Diagnosis not present

## 2022-06-09 ENCOUNTER — Ambulatory Visit: Payer: 59 | Admitting: Podiatry

## 2022-07-06 ENCOUNTER — Encounter: Payer: Self-pay | Admitting: Podiatry

## 2022-07-06 ENCOUNTER — Ambulatory Visit (INDEPENDENT_AMBULATORY_CARE_PROVIDER_SITE_OTHER): Payer: 59 | Admitting: Podiatry

## 2022-07-06 DIAGNOSIS — B351 Tinea unguium: Secondary | ICD-10-CM | POA: Diagnosis not present

## 2022-07-06 DIAGNOSIS — L84 Corns and callosities: Secondary | ICD-10-CM | POA: Diagnosis not present

## 2022-07-06 DIAGNOSIS — M79675 Pain in left toe(s): Secondary | ICD-10-CM | POA: Diagnosis not present

## 2022-07-06 DIAGNOSIS — M79674 Pain in right toe(s): Secondary | ICD-10-CM | POA: Diagnosis not present

## 2022-07-06 MED ORDER — CICLOPIROX 8 % EX SOLN: Freq: Every day | CUTANEOUS | 11 refills | Status: DC

## 2022-07-06 NOTE — Progress Notes (Signed)
       Subjective:  Patient ID: Shelly Meyer, female    DOB: 1950-04-05,  MRN: [id number]   Shelly Meyer presents to clinic today for:  Chief Complaint  Patient presents with   Nail Problem    Patient reports thick, discolored toenails onset about 2 years ago. Patient has used a over the counter topical treatment in the past without much improvement  . Patient notes nails are thick, discolored, elongated and painful in shoegear when trying to ambulate.  Patient also has some hard skin on the great toes bilateral.  An interpreter is present today.  A family member is also with her today.  PCP is Worthy Rancher, MD.  No Known Allergies  Review of Systems: Negative except as noted in the HPI.  Objective:  There were no vitals filed for this visit.  Shelly Meyer is a pleasant 72 y.o. female in NAD. AAO x 3.  Vascular Examination: Capillary refill time is 3-5 seconds to toes bilateral. Palpable pedal pulses b/l LE. Digital hair present b/l. No pedal edema b/l. Skin temperature gradient WNL b/l. No varicosities b/l. No cyanosis or clubbing noted b/l.   Dermatological Examination: Pedal skin with normal turgor, texture and tone b/l. No open wounds. No interdigital macerations b/l. Toenails x10 are 3mm thick, discolored, dystrophic with subungual debris. There is pain with compression of the nail plates.  They are elongated x10.  There is minimal hyperkeratosis/hard skin on the plantar medial aspect of the hallux IPJ bilateral.  No soft tissue breakdown is seen.  No erythema is noted.  Neurological Examination: Gross epicritic sensation intact bilateral LE.   Musculoskeletal Examination: Muscle strength 5/5 to all LE muscle groups b/l.   Assessment/Plan: 1. Pain due to onychomycosis of toenails of both feet   2. Foot callus     Meds ordered this encounter  Medications   ciclopirox (PENLAC) 8 % solution    Sig: Apply topically at bedtime. Apply over nail and surrounding skin.  Apply daily over previous coat. Remove weekly with polish remover.    Dispense:  6.6 mL    Refill:  11   The mycotic toenails were sharply debrided x10 with sterile nail nippers and a power debriding burr to decrease bulk/thickness and length.  Discussed antifungal options with the patient today, including over-the-counter, prescription topical, and prescription oral medications.  After the discussion, patient opted for the prescription topical medication.  Prescription for ciclopirox nail lacquer was sent to her pharmacy and she was instructed on how to apply this daily.  She will need to remove the lacquer buildup once weekly.  There was not enough callus buildup to shaved with a scalpel blade today.  Informed patient that she could apply RevitaDerm 40 cream to the hard skin nightly which should alleviate this issue for her.  Return in about 3 months (around 10/06/2022) for fungal nail check.   Clerance Lav, DPM, FACFAS [facility]     [address] [place], [place] [address]                Office [phone]  Fax [phone]

## 2022-07-07 DIAGNOSIS — M47812 Spondylosis without myelopathy or radiculopathy, cervical region: Secondary | ICD-10-CM | POA: Diagnosis not present

## 2022-08-09 DIAGNOSIS — Z1231 Encounter for screening mammogram for malignant neoplasm of breast: Secondary | ICD-10-CM | POA: Diagnosis not present

## 2022-08-09 DIAGNOSIS — R92323 Mammographic fibroglandular density, bilateral breasts: Secondary | ICD-10-CM | POA: Diagnosis not present

## 2022-08-25 ENCOUNTER — Other Ambulatory Visit: Payer: Self-pay

## 2022-08-25 ENCOUNTER — Ambulatory Visit (HOSPITAL_COMMUNITY)
Admission: EM | Admit: 2022-08-25 | Discharge: 2022-08-25 | Disposition: A | Discharge status: Home / Self Care | Payer: 59 | Attending: Physician Assistant | Admitting: Physician Assistant

## 2022-08-25 ENCOUNTER — Encounter (HOSPITAL_COMMUNITY): Payer: Self-pay | Admitting: Emergency Medicine

## 2022-08-25 DIAGNOSIS — N3001 Acute cystitis with hematuria: Secondary | ICD-10-CM | POA: Diagnosis not present

## 2022-08-25 DIAGNOSIS — R531 Weakness: Secondary | ICD-10-CM | POA: Diagnosis not present

## 2022-08-25 DIAGNOSIS — K6289 Other specified diseases of anus and rectum: Secondary | ICD-10-CM | POA: Diagnosis not present

## 2022-08-25 DIAGNOSIS — R102 Pelvic and perineal pain: Secondary | ICD-10-CM | POA: Diagnosis not present

## 2022-08-25 LAB — CBC WITH DIFFERENTIAL/PLATELET
Abs Immature Granulocytes: 0.02 10*3/uL (ref 0.00–0.07)
Basophils Absolute: 0 10*3/uL (ref 0.0–0.1)
Basophils Relative: 0 %
Eosinophils Absolute: 0.2 10*3/uL (ref 0.0–0.5)
Eosinophils Relative: 3 %
HCT: 42.1 % (ref 36.0–46.0)
Hemoglobin: 14 g/dL (ref 12.0–15.0)
Immature Granulocytes: 0 %
Lymphocytes Relative: 31 %
Lymphs Abs: 2.3 10*3/uL (ref 0.7–4.0)
MCH: 29.4 pg (ref 26.0–34.0)
MCHC: 33.3 g/dL (ref 30.0–36.0)
MCV: 88.4 fL (ref 80.0–100.0)
Monocytes Absolute: 0.6 10*3/uL (ref 0.1–1.0)
Monocytes Relative: 8 %
Neutro Abs: 4.3 10*3/uL (ref 1.7–7.7)
Neutrophils Relative %: 58 %
Platelets: 265 10*3/uL (ref 150–400)
RBC: 4.76 MIL/uL (ref 3.87–5.11)
RDW: 12.2 % (ref 11.5–15.5)
WBC: 7.4 10*3/uL (ref 4.0–10.5)
nRBC: 0 % (ref 0.0–0.2)

## 2022-08-25 LAB — POCT URINALYSIS DIP (MANUAL ENTRY)
Bilirubin, UA: NEGATIVE
Glucose, UA: NEGATIVE mg/dL
Ketones, POC UA: NEGATIVE mg/dL
Nitrite, UA: NEGATIVE
Protein Ur, POC: 30 mg/dL — AB
Spec Grav, UA: 1.02 (ref 1.010–1.025)
Urobilinogen, UA: 0.2 E.U./dL
pH, UA: 6 (ref 5.0–8.0)

## 2022-08-25 LAB — COMPREHENSIVE METABOLIC PANEL
ALT: 22 U/L (ref 0–44)
AST: 22 U/L (ref 15–41)
Albumin: 4.1 g/dL (ref 3.5–5.0)
Alkaline Phosphatase: 70 U/L (ref 38–126)
Anion gap: 12 (ref 5–15)
BUN: 12 mg/dL (ref 8–23)
CO2: 23 mmol/L (ref 22–32)
Calcium: 9 mg/dL (ref 8.9–10.3)
Chloride: 106 mmol/L (ref 98–111)
Creatinine, Ser: 0.48 mg/dL (ref 0.44–1.00)
GFR, Estimated: >60 mL/min (ref >60)
Glucose, Bld: 111 mg/dL — ABNORMAL HIGH (ref 70–99)
Potassium: 3.5 mmol/L (ref 3.5–5.1)
Sodium: 141 mmol/L (ref 135–145)
Total Bilirubin: 0.2 mg/dL — ABNORMAL LOW (ref 0.3–1.2)
Total Protein: 7 g/dL (ref 6.5–8.1)

## 2022-08-25 LAB — POCT FASTING CBG KUC MANUAL ENTRY: POCT Glucose (KUC): 115 mg/dL — AB (ref 70–99)

## 2022-08-25 LAB — POC HEMOCCULT BLD/STL (OFFICE/1-CARD/DIAGNOSTIC): Fecal Occult Blood, POC: NEGATIVE

## 2022-08-25 MED ORDER — HYDROCORTISONE (PERIANAL) 2.5 % EX CREA: 1.0000 | TOPICAL_CREAM | Freq: Two times a day (BID) | CUTANEOUS | 0 refills | Status: DC

## 2022-08-25 MED ORDER — CEPHALEXIN 500 MG PO CAPS: 500.0000 mg | ORAL_CAPSULE | Freq: Two times a day (BID) | ORAL | 0 refills | Status: DC

## 2022-08-25 NOTE — ED Provider Notes (Signed)
[facility]    CSN: [id number] Arrival date & time: 08/25/22  1244      History   Chief Complaint No chief complaint on file.   HPI Shelly Meyer is a 72 y.o. female.   Patient presents today companied by her daughter.  She is Spanish-speaking and video interpreter was utilized during visit.  She reports multiple different symptoms that all started about 3 to 5 days ago.  Her primary concern is rectal pain.  She denies any change to her bowels including constipation or diarrhea.  She has had darker stools but denies any melena or difficult to clean bowel movements.  Denies any associated fever, nausea, vomiting.  She does report some lower abdominal pain with some urinary frequency.  She is waking up at night with a dry mouth and having to drink more water.  Denies history of diabetes and does not believe that she is sleeping with her mouth open.  Denies history of Sjogren's or other autoimmune condition.  Denies any recent medication changes or head injury.  Does report she has a frontal headache that makes her eyes feel that they are going to pop out.  She is also experiencing widespread pain and generalized weakness.  Occasionally she will have numbness in her hands but has not identified any trigger.  She does have a history of carpal tunnel disease and states current symptoms are similar to previous episodes of this condition.  She has been seen in the emergency room at which point she had negative workup and was started on Naprosyn.  She has tried this and a muscle relaxer with minimal improvement of symptoms.  She does have a primary care provider but has not seen them in the past few months.  Reports that she is feeling so poorly that she cannot wait any longer prompting evaluation today.  Denies any significant cough or congestion.    Past Medical History:  Diagnosis Date   Carpal tunnel syndrome 02/28/2015   Cataract    LEFT eye sx completed/RIGHT eye to be scheduled  (06/10/2021)   Depression 02/28/2015   Hypertension    on meds   Osteoarthritis    spine/bilateral feet/hands   Spinal stenosis of lumbar region 02/28/2015   Varicose veins of both lower extremities     Patient Active Problem List   Diagnosis Date Noted   Rotator cuff tendinitis, left 04/19/2017   Dyspepsia 02/14/2017   Acute pain of left shoulder 02/14/2017   Osteoarthritis 10/29/2015   Spinal stenosis of lumbar region 02/28/2015   Carpal tunnel syndrome 02/28/2015   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      Home Medications    Prior to Admission medications   Medication Sig Start Date End Date Taking? Authorizing Provider  cephALEXin (KEFLEX) 500 MG capsule Take 1 capsule (500 mg total) by mouth 2 (two) times daily. 08/25/22  Yes Laurens Matheny, Noberto Retort, PA-C  hydrocortisone (ANUSOL-HC) 2.5 % rectal cream Place 1 Application rectally 2 (two) times daily. 08/25/22  Yes Kong Packett K, PA-C  naproxen (NAPRELAN) 500 MG 24 hr tablet Take  500 mg by mouth daily with breakfast.   Yes [provider]  lisinopril (ZESTRIL) 20 MG tablet TAKE 1 TABLET(20 MG) BY MOUTH DAILY 12/11/19   Marcine Matar, MD    Family History Family History  Problem Relation Age of Onset   Stroke Mother    Colon polyps Maternal Grandmother    Colon cancer Maternal Grandmother 2   Esophageal cancer Neg Hx    Rectal cancer Neg Hx    Stomach cancer Neg Hx     Social History Social History   Tobacco Use   Smoking status: Never   Smokeless tobacco: Never  Vaping Use   Vaping status: Never Used   Substance Use Topics   Alcohol use: No   Drug use: No     Allergies   Patient has no known allergies.   Review of Systems Review of Systems  Constitutional:  Positive for activity change. Negative for appetite change, fatigue and fever.  Respiratory:  Negative for cough and shortness of breath.   Cardiovascular:  Negative for chest pain.  Gastrointestinal:  Positive for rectal pain. Negative for abdominal pain, blood in stool, constipation, diarrhea, nausea and vomiting.  Musculoskeletal:  Positive for back pain. Negative for arthralgias and myalgias.  Neurological:  Positive for dizziness, weakness (generalized) and numbness (intermittently in hands). Negative for syncope, speech difficulty, light-headedness and headaches.     Physical Exam Triage Vital Signs ED Triage Vitals  Encounter Vitals Group     BP 08/25/22 1402 (!) 154/76     Systolic BP Percentile --      Diastolic BP Percentile --      Pulse Rate 08/25/22 1402 72     Resp 08/25/22 1402 20     Temp 08/25/22 1402 98.7 F (37.1 C)     Temp Source 08/25/22 1402 Oral     SpO2 08/25/22 1402 94 %     Weight --      Height --      Head Circumference --      Peak Flow --      Pain Score 08/25/22 1358 8     Pain Loc --      Pain Education --      Exclude from Growth Chart --    Orthostatic VS for the past 24 hrs:  BP- Lying Pulse- Lying BP- Sitting Pulse- Sitting BP- Standing at 0 minutes Pulse- Standing at 0 minutes  08/25/22 1452 113/72 69 136/76 70 128/72 75    Updated Vital Signs BP (!) 154/76 (BP Location: Left Arm)   Pulse 72   Temp 98.7 F (37.1 C) (Oral)   Resp 20   LMP  (LMP Unknown)   SpO2 94%   Visual Acuity Right Eye Distance:   Left Eye Distance:   Bilateral Distance:    Right Eye Near:   Left Eye Near:    Bilateral Near:     Physical Exam Vitals reviewed.  Constitutional:      General: She is awake. She is not in acute distress.    Appearance: Normal appearance. She is  well-developed. She is not ill-appearing.     Comments: Very pleasant female appears stated age in no acute distress sitting comfortably in exam room  HENT:     Head: Normocephalic and atraumatic. No raccoon eyes, Battle's sign or contusion.     Right Ear: Tympanic membrane, ear canal and external ear normal. No hemotympanum.     Left Ear: Tympanic membrane, ear canal and  external ear normal. No hemotympanum.     Nose: Nose normal.     Mouth/Throat:     Tongue: Tongue does not deviate from midline.     Pharynx: Uvula midline. No oropharyngeal exudate or posterior oropharyngeal erythema.  Eyes:     Extraocular Movements: Extraocular movements intact.     Conjunctiva/sclera: Conjunctivae normal.     Pupils: Pupils are equal, round, and reactive to light.  Cardiovascular:     Rate and Rhythm: Normal rate and regular rhythm.     Heart sounds: Normal heart sounds, S1 normal and S2 normal. No murmur heard. Pulmonary:     Effort: Pulmonary effort is normal.     Breath sounds: Normal breath sounds. No wheezing, rhonchi or rales.     Comments: Clear auscultation bilaterally Abdominal:     Palpations: Abdomen is soft.     Tenderness: There is abdominal tenderness in the suprapubic area. There is no right CVA tenderness, left CVA tenderness, guarding or rebound.     Comments: Mild tenderness palpation in lower abdomen.  No evidence of acute abdomen on physical exam.  Genitourinary:    Rectum: Tenderness present. No external hemorrhoid or internal hemorrhoid. Normal anal tone.     Comments: Multiple skin tags noted with no active internal or external hemorrhoid.  No significant tenderness palpation.  Normal tone.  Dee, CMA present as chaperone during exam. Musculoskeletal:     Cervical back: Normal range of motion and neck supple.     Comments: Strength 5/5 bilateral upper and lower extremities.  Lymphadenopathy:     Head:     Right side of head: No submental, submandibular or tonsillar  adenopathy.     Left side of head: No submental, submandibular or tonsillar adenopathy.  Neurological:     General: No focal deficit present.     Mental Status: She is alert and oriented to person, place, and time.     Cranial Nerves: Cranial nerves 2-12 are intact.     Motor: Motor function is intact.     Coordination: Coordination is intact.     Gait: Gait is intact.     Comments: Cranial nerves II through XII grossly intact.  No focal neurological defect on exam.  Psychiatric:        Behavior: Behavior is cooperative.      UC Treatments / Results  Labs (all labs ordered are listed, but only abnormal results are displayed) Labs Reviewed  POCT URINALYSIS DIP (MANUAL ENTRY) - Abnormal; Notable for the following components:      Result Value   Clarity, UA cloudy (*)    Blood, UA trace-intact (*)    Protein Ur, POC =30 (*)    Leukocytes, UA Moderate (2+) (*)    All other components within normal limits  POCT FASTING CBG [facility] MANUAL ENTRY - Abnormal; Notable for the following components:   POCT Glucose ([facility]) 115 (*)    All other components within normal limits  URINE CULTURE  CBC WITH DIFFERENTIAL/PLATELET  COMPREHENSIVE METABOLIC PANEL  POC HEMOCCULT BLD/STL (OFFICE/1-CARD/DIAGNOSTIC)    EKG   Radiology No results found.  Procedures Procedures (including critical care time)  Medications Ordered in UC Medications - No data to display  Initial Impression / Assessment and Plan / UC Course  I have reviewed the triage vital signs and the nursing notes.  Pertinent labs & imaging results that were available during my care of the patient were reviewed by me and considered in my medical decision making (  see chart for details).     Patient is well-appearing, afebrile, nontoxic, nontachycardic.  Vital signs and physical exam are reassuring with no indication for emergent evaluation or imaging.  EKG was obtained given dizziness/lightheadedness that showed normal sinus rhythm  with ventricular rate of 74 bpm with right bundle blanch block without ischemic changes; compared to 05/17/2022 tracing no significant change.  Orthostatic vital signs were appropriate.  Random blood sugar was normal.  UA did have evidence of infection with positive blood and leukocyte esterase.  Discussed that this could be contributing to her general malaise, body pain so we will treat with cephalexin 500 mg twice daily for 5 days.  Urine culture was obtained and is pending.  We will contact her if we need to change or stop her medication based on her culture results.  Basic labs including CBC and CMP were obtained today and are pending.  Rectal exam was normal with no occult blood.  I suspect that the discomfort is related to irritation from skin tags.  We discussed the importance of hygiene and she was given Anusol to help manage symptoms.  She is to push fluids and eat a high-fiber diet to avoid constipation which could be contributing to symptoms.  Recommended that she follow-up closely with her primary care ideally within the week.  We discussed that if she has any worsening or changing symptoms including weakness, fever, nausea, vomiting she needs to be seen in the emergency room.  Strict return precautions given.  All questions answered to patient's satisfaction.  Final Clinical Impressions(s) / UC Diagnoses   Final diagnoses:  Acute cystitis with hematuria  Suprapubic pain  Anal irritation  Generalized weakness     Discharge Instructions      Your urine showed evidence of urinary tract infection.  This could be contributing to your symptoms.  Please start cephalexin twice daily for 5 days.  We will contact you if need to change your treatment based on your culture results.  I will contact you if your lab work is abnormal.  You did not have any blood in your stool.  I believe your discomfort is related to irritation of the skin.  Use hydrocortisone cream twice daily as needed.  Make sure to  keep this area clean.  Your vital signs were normal.  You can use over-the-counter medications including Tylenol and ibuprofen as needed for pain.  Make sure that you are drinking plenty of fluid.  Follow-up with your primary care within the week.  If you have any worsening symptoms including increasing pain, weakness, nausea/vomiting, fever you need to be seen immediately.  Su orina mostr evidencia de infeccin del tracto urinario.  Esto podra estar contribuyendo a sus sntomas.  Comience con Radiation protection practitioner al da durante 5 [place].  Nos comunicaremos con usted si necesita cambiar su tratamiento segn los [address] de su cultivo.  Me comunicar con usted si su anlisis de laboratorio es anormal.  No tena sangre en las heces.  Creo que tu malestar est relacionado con la irritacin de la piel.  Use crema de hidrocortisona dos veces al da segn sea necesario.  Asegrese de mantener limpia esta rea.  Sus signos vitales eran normales.  Puede utilizar medicamentos de [employer], incluidos Tylenol e ibuprofeno, segn sea necesario para Chief Technology Officer.  Asegrese de beber mucho lquido.  Haga un seguimiento con su atencin primaria dentro de la semana.  Si tiene algn sntoma que [place], incluido un aumento del [place], debilidad, nuseas/vmitos  o fiebre, debe ser atendido de inmediato.     ED Prescriptions     Medication Sig Dispense Auth. Provider   cephALEXin (KEFLEX) 500 MG capsule Take 1 capsule (500 mg total) by mouth 2 (two) times daily. 10 capsule Arnita Koons K, PA-C   hydrocortisone (ANUSOL-HC) 2.5 % rectal cream Place 1 Application rectally 2 (two) times daily. 30 g Maleeyah Mccaughey, Noberto Retort, PA-C      PDMP not reviewed this encounter.   Jeani Hawking, PA-C 08/25/22 1551

## 2022-08-25 NOTE — ED Triage Notes (Signed)
Complains of headaches, stomach pain and rectal itching and burning- inside.  .  Symptoms for 5 days.  Denies diarrhea.  head pain involves entire head, eyes feel like they will "pop out".  This morning felt dizzy    Has not taken any medications for symptoms.  Daughter purchased a ointment with zinc for rectum

## 2022-08-25 NOTE — Discharge Instructions (Addendum)
Your urine showed evidence of urinary tract infection.  This could be contributing to your symptoms.  Please start cephalexin twice daily for 5 days.  We will contact you if need to change your treatment based on your culture results.  I will contact you if your lab work is abnormal.  You did not have any blood in your stool.  I believe your discomfort is related to irritation of the skin.  Use hydrocortisone cream twice daily as needed.  Make sure to keep this area clean.  Your vital signs were normal.  You can use over-the-counter medications including Tylenol and ibuprofen as needed for pain.  Make sure that you are drinking plenty of fluid.  Follow-up with your primary care within the week.  If you have any worsening symptoms including increasing pain, weakness, nausea/vomiting, fever you need to be seen immediately.  Su orina mostr evidencia de infeccin del tracto urinario.  Esto podra estar contribuyendo a sus sntomas.  Comience con Radiation protection practitioner al da durante 5 [place].  Nos comunicaremos con usted si necesita cambiar su tratamiento segn los [address] de su cultivo.  Me comunicar con usted si su anlisis de laboratorio es anormal.  No tena sangre en las heces.  Creo que tu malestar est relacionado con la irritacin de la piel.  Use crema de hidrocortisona dos veces al da segn sea necesario.  Asegrese de mantener limpia esta rea.  Sus signos vitales eran normales.  Puede utilizar medicamentos de [employer], incluidos Tylenol e ibuprofeno, segn sea necesario para Chief Technology Officer.  Asegrese de beber mucho lquido.  Haga un seguimiento con su atencin primaria dentro de la semana.  Si tiene algn sntoma que [place], incluido un aumento del [place], debilidad, nuseas/vmitos o [place], debe ser atendido de inmediato.

## 2022-08-26 LAB — URINE CULTURE: Culture: <10000 — AB

## 2022-08-31 ENCOUNTER — Ambulatory Visit (HOSPITAL_COMMUNITY)
Admission: EM | Admit: 2022-08-31 | Discharge: 2022-08-31 | Disposition: A | Discharge status: Home / Self Care | Payer: 59 | Attending: Family Medicine | Admitting: Family Medicine

## 2022-08-31 ENCOUNTER — Encounter (HOSPITAL_COMMUNITY): Payer: Self-pay | Admitting: *Deleted

## 2022-08-31 DIAGNOSIS — M792 Neuralgia and neuritis, unspecified: Secondary | ICD-10-CM

## 2022-08-31 MED ORDER — GABAPENTIN 100 MG PO CAPS: 100.0000 mg | ORAL_CAPSULE | Freq: Every day | ORAL | 0 refills | Status: AC

## 2022-08-31 MED ORDER — NYSTATIN 100000 UNIT/GM EX CREA: TOPICAL_CREAM | Freq: Two times a day (BID) | CUTANEOUS | 0 refills | Status: AC

## 2022-08-31 NOTE — ED Triage Notes (Signed)
Pt states she finished the meds that were given at the past OV but she is still having rectal itching and headache.

## 2022-08-31 NOTE — Discharge Instructions (Signed)
Nystatin cream--apply 2 times daily to the affected area until better, about 2 weeks (aplique 2 veces al dia a la area donde Ud esta irritada)  Gabapentin 100 mg--start taking 1 daily in the evening at bedtime.  Then after 2 or 3 days, increase to 2 capsules daily in the evening.  Then after another few days increase to 3 capsules daily.  This is for nerve pain.  It can cause dizziness and sleepiness (empiece tomar 1 diaria al acostarse; Despues 2-3 dias, suba el dosis a 2 capsulas en la noche. Puede subir el dosis a 3 capsulas diaria en 3 mas dias. Esta medicina puede causar sueno o mareo). Es para Engineer, mining de nervios.  Please follow-up with your primary care provider so they can just these medications if they are helping and help you decide what further evaluation you need.  Also discuss with them about colonoscopy and if it is time for you to do that. (Por favor hable con su doctor general sobre este problema. Ellos pueden ayudarle con diferente tratamiento, cambiando dosis, mas evaluacion. Tambien pregunta si es tiempo para colonoscopia)

## 2022-08-31 NOTE — ED Provider Notes (Addendum)
[facility]    CSN: [id number] Arrival date & time: 08/31/22  1430      History   Chief Complaint Chief Complaint  Patient presents with   Anal Itching    HPI Shelly Meyer is a 72 y.o. female.   HPI Here for continued burning pain of her rectal area and perineum.  On about July 13 she began having burning pain in her rectal area.  She was seen and evaluated here on July 17.  Exam of her rectal area did not show any problem.  Urinalysis was abnormal and she was begun on Keflex for possible UTI.  Urine culture showed less than 10 colony count of bacteria.  She comes in today stating that her burning pain is not improved and it is worsened now.  She states at 2 in the early morning today she almost called the ambulance she was feeling so bad.  Her burning pain has extended now onto her perineum.  It is a constant pain.  No fever or chills, and no nausea or vomiting or diarrhea.  Bowel movements have been normal  No upper respiratory symptoms or cough or shortness of breath.  No abdominal pain.  Past medical history significant for prediabetes.  She states her last colonoscopy was about 10 years ago.  She has not had any blood in her stool or bowel habit changes  Past Medical History:  Diagnosis Date   Carpal tunnel syndrome 02/28/2015   Cataract    LEFT eye sx completed/RIGHT eye to be scheduled (06/10/2021)   Depression 02/28/2015   Hypertension    on meds   Osteoarthritis    spine/bilateral feet/hands   Spinal stenosis of lumbar region 02/28/2015   Varicose veins of both lower extremities     Patient Active Problem List   Diagnosis Date Noted   Rotator cuff tendinitis, left 04/19/2017   Dyspepsia 02/14/2017   Acute pain of left shoulder 02/14/2017   Osteoarthritis 10/29/2015   Spinal stenosis of lumbar region 02/28/2015   Carpal tunnel syndrome 02/28/2015   Varicose veins of bilateral lower extremities with other complications 02/28/2015   Achilles  tendinitis of left lower extremity 02/28/2015   Depression 02/28/2015    Past Surgical History:  Procedure Laterality Date   CATARACT EXTRACTION Left    ENDOVENOUS ABLATION SAPHENOUS VEIN W/ LASER Right 05/14/2016   endovenous laser ablation right greater saphenous vein by Josephina Gip MD     OB History   No obstetric history on file.      Home Medications    Prior to Admission medications   Medication Sig Start Date End Date Taking? Authorizing Provider  gabapentin (NEURONTIN) 100 MG capsule Take 1-3 capsules (100-300 mg total) by mouth at bedtime. 08/31/22  Yes Zenia Resides, MD  lisinopril (ZESTRIL) 20 MG tablet TAKE 1 TABLET(20 MG) BY MOUTH DAILY 12/11/19  Yes Marcine Matar, MD  nystatin cream (MYCOSTATIN) Apply topically 2 (two) times daily. Apply to affected area 2 times daily until better 08/31/22  Yes Cornelia Walraven, Janace Aris, MD    Family History Family History  Problem Relation Age of Onset   Stroke Mother    Colon polyps Maternal Grandmother    Colon cancer Maternal Grandmother 37   Esophageal cancer Neg Hx    Rectal cancer Neg Hx    Stomach cancer Neg Hx     Social History Social History   Tobacco Use   Smoking status: Never   Smokeless tobacco: Never  Vaping  Use   Vaping status: Never Used  Substance Use Topics   Alcohol use: No   Drug use: No     Allergies   Patient has no known allergies.   Review of Systems Review of Systems   Physical Exam Triage Vital Signs ED Triage Vitals [08/31/22 1552]  Encounter Vitals Group     BP (!) 159/87     Systolic BP Percentile      Diastolic BP Percentile      Pulse Rate 77     Resp 18     Temp 97.7 F (36.5 C)     Temp Source Oral     SpO2 96 %     Weight      Height      Head Circumference      Peak Flow      Pain Score      Pain Loc      Pain Education      Exclude from Growth Chart    No data found.  Updated Vital Signs BP (!) 159/87 (BP Location: Left Arm)   Pulse 77   Temp  97.7 F (36.5 C) (Oral)   Resp 18   LMP  (LMP Unknown)   SpO2 96%   Visual Acuity Right Eye Distance:   Left Eye Distance:   Bilateral Distance:    Right Eye Near:   Left Eye Near:    Bilateral Near:     Physical Exam Vitals reviewed.  Constitutional:      General: She is not in acute distress.    Appearance: She is not ill-appearing, toxic-appearing or diaphoretic.  HENT:     Mouth/Throat:     Mouth: Mucous membranes are moist.  Eyes:     Extraocular Movements: Extraocular movements intact.     Conjunctiva/sclera: Conjunctivae normal.     Pupils: Pupils are equal, round, and reactive to light.  Cardiovascular:     Rate and Rhythm: Normal rate and regular rhythm.     Heart sounds: No murmur heard. Pulmonary:     Effort: Pulmonary effort is normal.     Breath sounds: Normal breath sounds.  Abdominal:     Palpations: Abdomen is soft.     Tenderness: There is no abdominal tenderness.  Genitourinary:    Comments: Examination of the perineum in the gluteal crevice shows some very faint erythema at most.  There are no masses or ulcerations.  I cannot palpate any masses in her inguinal area.  The rectum on inspection has some hemorrhoidal tags that are soft.  Digital rectal exam does not reveal any mass on exam. Musculoskeletal:     Cervical back: Neck supple.  Lymphadenopathy:     Cervical: No cervical adenopathy.  Skin:    Coloration: Skin is not jaundiced or pale.  Neurological:     General: No focal deficit present.     Mental Status: She is alert and oriented to person, place, and time.  Psychiatric:        Behavior: Behavior normal.      UC Treatments / Results  Labs (all labs ordered are listed, but only abnormal results are displayed) Labs Reviewed - No data to display  EKG   Radiology No results found.  Procedures Procedures (including critical care time)  Medications Ordered in UC Medications - No data to display  Initial Impression /  Assessment and Plan / UC Course  I have reviewed the triage vital signs and the nursing notes.  Pertinent  labs & imaging results that were available during my care of the patient were reviewed by me and considered in my medical decision making (see chart for details).    Visit is conducted in Spanish  I think she is describing some neuropathic pain.  Since there is some very faint erythema of the area I am still going to prescribe some nystatin cream for her to apply.  Gabapentin is sent in for her to begin taking 100 mg and she can titrate up to 300 mg at night.  I have discussed with her the rationale for trying this medication.  I have asked her to follow-up with her primary care so that they can adjust this medication if it is helpful, and so they can evaluate her further.  I discussed with her that it sounds a good sign for a screening colonoscopy anyway.  She expresses concern as she had a grandmother who had colon cancer.  She will follow-up with her primary care about all these issues.   Final Clinical Impressions(s) / UC Diagnoses   Final diagnoses:  Neuropathic pain     Discharge Instructions      Nystatin cream--apply 2 times daily to the affected area until better, about 2 weeks (aplique 2 veces al dia a la area donde Ud esta irritada)  Gabapentin 100 mg--start taking 1 daily in the evening at bedtime.  Then after 2 or 3 days, increase to 2 capsules daily in the evening.  Then after another few days increase to 3 capsules daily.  This is for nerve pain.  It can cause dizziness and sleepiness (empiece tomar 1 diaria al acostarse; Despues 2-3 dias, suba el dosis a 2 capsulas en la noche. Puede subir el dosis a 3 capsulas diaria en 3 mas dias. Esta medicina puede causar sueno o mareo). Es para Engineer, mining de nervios.  Please follow-up with your primary care provider so they can just these medications if they are helping and help you decide what further evaluation you need.  Also  discuss with them about colonoscopy and if it is time for you to do that. (Por favor hable con su doctor general sobre este problema. Ellos pueden ayudarle con diferente tratamiento, cambiando dosis, mas evaluacion. Tambien pregunta si es tiempo para colonoscopia)     ED Prescriptions     Medication Sig Dispense Auth. Provider   nystatin cream (MYCOSTATIN) Apply topically 2 (two) times daily. Apply to affected area 2 times daily until better 60 g Zenia Resides, MD   gabapentin (NEURONTIN) 100 MG capsule Take 1-3 capsules (100-300 mg total) by mouth at bedtime. 90 capsule Tomio Kirk, Janace Aris, MD      PDMP not reviewed this encounter.   Zenia Resides, MD 08/31/22 1630    Zenia Resides, MD 08/31/22 [phone]

## 2022-09-02 DIAGNOSIS — Z599 Problem related to housing and economic circumstances, unspecified: Secondary | ICD-10-CM | POA: Diagnosis not present

## 2022-09-02 DIAGNOSIS — L290 Pruritus ani: Secondary | ICD-10-CM | POA: Diagnosis not present

## 2022-09-02 DIAGNOSIS — D692 Other nonthrombocytopenic purpura: Secondary | ICD-10-CM | POA: Diagnosis not present

## 2022-09-02 DIAGNOSIS — Z639 Problem related to primary support group, unspecified: Secondary | ICD-10-CM | POA: Diagnosis not present

## 2022-09-02 DIAGNOSIS — E538 Deficiency of other specified B group vitamins: Secondary | ICD-10-CM | POA: Diagnosis not present

## 2022-09-02 DIAGNOSIS — H269 Unspecified cataract: Secondary | ICD-10-CM | POA: Diagnosis not present

## 2022-09-02 DIAGNOSIS — G5603 Carpal tunnel syndrome, bilateral upper limbs: Secondary | ICD-10-CM | POA: Diagnosis not present

## 2022-09-02 DIAGNOSIS — G63 Polyneuropathy in diseases classified elsewhere: Secondary | ICD-10-CM | POA: Diagnosis not present

## 2022-09-02 DIAGNOSIS — Z0289 Encounter for other administrative examinations: Secondary | ICD-10-CM | POA: Diagnosis not present

## 2022-09-02 DIAGNOSIS — Z86010 Personal history of colonic polyps: Secondary | ICD-10-CM | POA: Diagnosis not present

## 2022-09-02 DIAGNOSIS — I700 Atherosclerosis of aorta: Secondary | ICD-10-CM | POA: Diagnosis not present

## 2022-09-06 DIAGNOSIS — L290 Pruritus ani: Secondary | ICD-10-CM | POA: Diagnosis not present

## 2022-09-06 DIAGNOSIS — Z0289 Encounter for other administrative examinations: Secondary | ICD-10-CM | POA: Diagnosis not present

## 2022-09-06 DIAGNOSIS — I10 Essential (primary) hypertension: Secondary | ICD-10-CM | POA: Diagnosis not present

## 2022-09-06 DIAGNOSIS — E785 Hyperlipidemia, unspecified: Secondary | ICD-10-CM | POA: Diagnosis not present

## 2022-09-13 DIAGNOSIS — L290 Pruritus ani: Secondary | ICD-10-CM | POA: Diagnosis not present

## 2022-09-13 DIAGNOSIS — Z0289 Encounter for other administrative examinations: Secondary | ICD-10-CM | POA: Diagnosis not present

## 2022-10-04 ENCOUNTER — Encounter: Payer: 59 | Admitting: Podiatry

## 2022-10-04 DIAGNOSIS — Z800 Family history of malignant neoplasm of digestive organs: Secondary | ICD-10-CM | POA: Diagnosis not present

## 2022-10-04 DIAGNOSIS — M5442 Lumbago with sciatica, left side: Secondary | ICD-10-CM | POA: Diagnosis not present

## 2022-10-04 DIAGNOSIS — M5441 Lumbago with sciatica, right side: Secondary | ICD-10-CM | POA: Diagnosis not present

## 2022-10-04 DIAGNOSIS — Z789 Other specified health status: Secondary | ICD-10-CM | POA: Diagnosis not present

## 2022-10-04 DIAGNOSIS — R1032 Left lower quadrant pain: Secondary | ICD-10-CM | POA: Diagnosis not present

## 2022-10-04 DIAGNOSIS — K641 Second degree hemorrhoids: Secondary | ICD-10-CM | POA: Diagnosis not present

## 2022-10-04 DIAGNOSIS — Z86010 Personal history of colonic polyps: Secondary | ICD-10-CM | POA: Diagnosis not present

## 2022-10-04 DIAGNOSIS — K644 Residual hemorrhoidal skin tags: Secondary | ICD-10-CM | POA: Diagnosis not present

## 2022-10-04 DIAGNOSIS — R1031 Right lower quadrant pain: Secondary | ICD-10-CM | POA: Diagnosis not present

## 2022-10-04 NOTE — Progress Notes (Signed)
 Patient was a no-show for today's scheduled appointment.  She will need to call and reschedule

## 2022-10-05 ENCOUNTER — Other Ambulatory Visit: Payer: Self-pay | Admitting: Surgery

## 2022-10-05 DIAGNOSIS — R1031 Right lower quadrant pain: Secondary | ICD-10-CM

## 2022-10-05 DIAGNOSIS — M5441 Lumbago with sciatica, right side: Secondary | ICD-10-CM

## 2022-10-05 DIAGNOSIS — Z86010 Personal history of colonic polyps: Secondary | ICD-10-CM

## 2022-10-07 ENCOUNTER — Ambulatory Visit
Admission: RE | Admit: 2022-10-07 | Discharge: 2022-10-07 | Disposition: A | Discharge status: Home / Self Care | Payer: 59 | Source: Ambulatory Visit | Attending: Surgery | Admitting: Surgery

## 2022-10-07 DIAGNOSIS — R1031 Right lower quadrant pain: Secondary | ICD-10-CM | POA: Diagnosis not present

## 2022-10-07 DIAGNOSIS — M5441 Lumbago with sciatica, right side: Secondary | ICD-10-CM

## 2022-10-07 DIAGNOSIS — M5442 Lumbago with sciatica, left side: Secondary | ICD-10-CM | POA: Diagnosis not present

## 2022-10-07 DIAGNOSIS — R1032 Left lower quadrant pain: Secondary | ICD-10-CM | POA: Diagnosis not present

## 2022-10-07 DIAGNOSIS — Z86010 Personal history of colonic polyps: Secondary | ICD-10-CM

## 2022-10-07 MED ORDER — IOPAMIDOL (ISOVUE-300) INJECTION 61%
500.0000 mL | Freq: Once | INTRAVENOUS | Status: AC | PRN
  Administered 2022-10-07: 100 mL via INTRAVENOUS

## 2022-11-03 DIAGNOSIS — G4700 Insomnia, unspecified: Secondary | ICD-10-CM | POA: Diagnosis not present

## 2022-11-03 DIAGNOSIS — E538 Deficiency of other specified B group vitamins: Secondary | ICD-10-CM | POA: Diagnosis not present

## 2022-11-03 DIAGNOSIS — G63 Polyneuropathy in diseases classified elsewhere: Secondary | ICD-10-CM | POA: Diagnosis not present

## 2022-11-03 DIAGNOSIS — M25511 Pain in right shoulder: Secondary | ICD-10-CM | POA: Diagnosis not present

## 2022-11-03 DIAGNOSIS — Z0289 Encounter for other administrative examinations: Secondary | ICD-10-CM | POA: Diagnosis not present

## 2022-11-03 DIAGNOSIS — L290 Pruritus ani: Secondary | ICD-10-CM | POA: Diagnosis not present

## 2022-11-03 DIAGNOSIS — I10 Essential (primary) hypertension: Secondary | ICD-10-CM | POA: Diagnosis not present

## 2022-12-08 DIAGNOSIS — Z7689 Persons encountering health services in other specified circumstances: Secondary | ICD-10-CM | POA: Diagnosis not present

## 2022-12-08 DIAGNOSIS — Z7189 Other specified counseling: Secondary | ICD-10-CM | POA: Diagnosis not present

## 2022-12-08 DIAGNOSIS — Z13228 Encounter for screening for other metabolic disorders: Secondary | ICD-10-CM | POA: Diagnosis not present

## 2022-12-08 DIAGNOSIS — Z713 Dietary counseling and surveillance: Secondary | ICD-10-CM | POA: Diagnosis not present

## 2022-12-08 DIAGNOSIS — E7849 Other hyperlipidemia: Secondary | ICD-10-CM | POA: Diagnosis not present

## 2022-12-08 DIAGNOSIS — Z8639 Personal history of other endocrine, nutritional and metabolic disease: Secondary | ICD-10-CM | POA: Diagnosis not present

## 2022-12-08 DIAGNOSIS — Z7182 Exercise counseling: Secondary | ICD-10-CM | POA: Diagnosis not present

## 2022-12-22 DIAGNOSIS — Z713 Dietary counseling and surveillance: Secondary | ICD-10-CM | POA: Diagnosis not present

## 2022-12-29 DIAGNOSIS — M48061 Spinal stenosis, lumbar region without neurogenic claudication: Secondary | ICD-10-CM | POA: Diagnosis not present

## 2022-12-29 DIAGNOSIS — M25551 Pain in right hip: Secondary | ICD-10-CM | POA: Diagnosis not present

## 2022-12-29 DIAGNOSIS — R7303 Prediabetes: Secondary | ICD-10-CM | POA: Diagnosis not present

## 2022-12-29 DIAGNOSIS — E538 Deficiency of other specified B group vitamins: Secondary | ICD-10-CM | POA: Diagnosis not present

## 2022-12-29 DIAGNOSIS — Z0289 Encounter for other administrative examinations: Secondary | ICD-10-CM | POA: Diagnosis not present

## 2022-12-29 DIAGNOSIS — G63 Polyneuropathy in diseases classified elsewhere: Secondary | ICD-10-CM | POA: Diagnosis not present

## 2023-01-03 DIAGNOSIS — Z713 Dietary counseling and surveillance: Secondary | ICD-10-CM | POA: Diagnosis not present

## 2023-01-25 DIAGNOSIS — Z713 Dietary counseling and surveillance: Secondary | ICD-10-CM | POA: Diagnosis not present

## 2023-02-24 DIAGNOSIS — E538 Deficiency of other specified B group vitamins: Secondary | ICD-10-CM | POA: Diagnosis not present

## 2023-02-24 DIAGNOSIS — Z1383 Encounter for screening for respiratory disorder NEC: Secondary | ICD-10-CM | POA: Diagnosis not present

## 2023-02-24 DIAGNOSIS — J069 Acute upper respiratory infection, unspecified: Secondary | ICD-10-CM | POA: Diagnosis not present

## 2023-02-24 DIAGNOSIS — R809 Proteinuria, unspecified: Secondary | ICD-10-CM | POA: Diagnosis not present

## 2023-02-24 DIAGNOSIS — I10 Essential (primary) hypertension: Secondary | ICD-10-CM | POA: Diagnosis not present

## 2023-02-24 DIAGNOSIS — E785 Hyperlipidemia, unspecified: Secondary | ICD-10-CM | POA: Diagnosis not present

## 2023-02-24 DIAGNOSIS — U071 COVID-19: Secondary | ICD-10-CM | POA: Diagnosis not present

## 2023-02-24 DIAGNOSIS — G63 Polyneuropathy in diseases classified elsewhere: Secondary | ICD-10-CM | POA: Diagnosis not present

## 2023-03-16 DIAGNOSIS — Z961 Presence of intraocular lens: Secondary | ICD-10-CM | POA: Diagnosis not present

## 2023-03-16 DIAGNOSIS — H179 Unspecified corneal scar and opacity: Secondary | ICD-10-CM | POA: Diagnosis not present

## 2023-05-13 DIAGNOSIS — Z7689 Persons encountering health services in other specified circumstances: Secondary | ICD-10-CM | POA: Diagnosis not present

## 2023-05-20 DIAGNOSIS — B351 Tinea unguium: Secondary | ICD-10-CM | POA: Diagnosis not present

## 2023-05-20 DIAGNOSIS — I10 Essential (primary) hypertension: Secondary | ICD-10-CM | POA: Diagnosis not present

## 2023-05-20 DIAGNOSIS — G5603 Carpal tunnel syndrome, bilateral upper limbs: Secondary | ICD-10-CM | POA: Diagnosis not present

## 2023-05-20 DIAGNOSIS — M48061 Spinal stenosis, lumbar region without neurogenic claudication: Secondary | ICD-10-CM | POA: Diagnosis not present

## 2023-05-20 DIAGNOSIS — M5412 Radiculopathy, cervical region: Secondary | ICD-10-CM | POA: Diagnosis not present

## 2023-05-20 DIAGNOSIS — I700 Atherosclerosis of aorta: Secondary | ICD-10-CM | POA: Diagnosis not present

## 2023-05-20 DIAGNOSIS — R7303 Prediabetes: Secondary | ICD-10-CM | POA: Diagnosis not present

## 2023-05-20 DIAGNOSIS — M5416 Radiculopathy, lumbar region: Secondary | ICD-10-CM | POA: Diagnosis not present

## 2023-06-13 DIAGNOSIS — R7303 Prediabetes: Secondary | ICD-10-CM | POA: Diagnosis not present

## 2023-06-13 DIAGNOSIS — I10 Essential (primary) hypertension: Secondary | ICD-10-CM | POA: Diagnosis not present

## 2023-06-13 DIAGNOSIS — Z1231 Encounter for screening mammogram for malignant neoplasm of breast: Secondary | ICD-10-CM | POA: Diagnosis not present

## 2023-06-13 DIAGNOSIS — Z0001 Encounter for general adult medical examination with abnormal findings: Secondary | ICD-10-CM | POA: Diagnosis not present

## 2023-06-13 DIAGNOSIS — E782 Mixed hyperlipidemia: Secondary | ICD-10-CM | POA: Diagnosis not present

## 2023-06-13 DIAGNOSIS — Z1159 Encounter for screening for other viral diseases: Secondary | ICD-10-CM | POA: Diagnosis not present

## 2023-06-13 DIAGNOSIS — E538 Deficiency of other specified B group vitamins: Secondary | ICD-10-CM | POA: Diagnosis not present

## 2023-06-13 DIAGNOSIS — R54 Age-related physical debility: Secondary | ICD-10-CM | POA: Diagnosis not present

## 2023-06-13 DIAGNOSIS — Z1389 Encounter for screening for other disorder: Secondary | ICD-10-CM | POA: Diagnosis not present

## 2023-06-13 DIAGNOSIS — R413 Other amnesia: Secondary | ICD-10-CM | POA: Diagnosis not present

## 2023-06-13 DIAGNOSIS — E559 Vitamin D deficiency, unspecified: Secondary | ICD-10-CM | POA: Diagnosis not present

## 2023-07-07 DIAGNOSIS — M8589 Other specified disorders of bone density and structure, multiple sites: Secondary | ICD-10-CM | POA: Diagnosis not present

## 2023-07-07 DIAGNOSIS — M85852 Other specified disorders of bone density and structure, left thigh: Secondary | ICD-10-CM | POA: Diagnosis not present

## 2023-07-07 DIAGNOSIS — Z1231 Encounter for screening mammogram for malignant neoplasm of breast: Secondary | ICD-10-CM | POA: Diagnosis not present

## 2023-08-28 ENCOUNTER — Emergency Department (HOSPITAL_BASED_OUTPATIENT_CLINIC_OR_DEPARTMENT_OTHER)
Admission: EM | Admit: 2023-08-28 | Discharge: 2023-08-28 | Disposition: A | Discharge status: Home / Self Care | Attending: Emergency Medicine | Admitting: Emergency Medicine

## 2023-08-28 ENCOUNTER — Other Ambulatory Visit: Payer: Self-pay

## 2023-08-28 ENCOUNTER — Emergency Department (HOSPITAL_BASED_OUTPATIENT_CLINIC_OR_DEPARTMENT_OTHER)

## 2023-08-28 DIAGNOSIS — I10 Essential (primary) hypertension: Secondary | ICD-10-CM | POA: Insufficient documentation

## 2023-08-28 DIAGNOSIS — R5383 Other fatigue: Secondary | ICD-10-CM | POA: Insufficient documentation

## 2023-08-28 DIAGNOSIS — R519 Headache, unspecified: Secondary | ICD-10-CM | POA: Diagnosis present

## 2023-08-28 DIAGNOSIS — R531 Weakness: Secondary | ICD-10-CM | POA: Diagnosis not present

## 2023-08-28 DIAGNOSIS — Z79899 Other long term (current) drug therapy: Secondary | ICD-10-CM | POA: Insufficient documentation

## 2023-08-28 LAB — COMPREHENSIVE METABOLIC PANEL WITH GFR
ALT: 25 U/L (ref 0–44)
AST: 24 U/L (ref 15–41)
Albumin: 4.2 g/dL (ref 3.5–5.0)
Alkaline Phosphatase: 96 U/L (ref 38–126)
Anion gap: 12 (ref 5–15)
BUN: 15 mg/dL (ref 8–23)
CO2: 26 mmol/L (ref 22–32)
Calcium: 9.3 mg/dL (ref 8.9–10.3)
Chloride: 103 mmol/L (ref 98–111)
Creatinine, Ser: 0.51 mg/dL (ref 0.44–1.00)
GFR, Estimated: >60 mL/min (ref >60)
Glucose, Bld: 108 mg/dL — ABNORMAL HIGH (ref 70–99)
Potassium: 3.5 mmol/L (ref 3.5–5.1)
Sodium: 140 mmol/L (ref 135–145)
Total Bilirubin: 0.4 mg/dL (ref 0.0–1.2)
Total Protein: 7.1 g/dL (ref 6.5–8.1)

## 2023-08-28 LAB — CBC WITH DIFFERENTIAL/PLATELET
Abs Immature Granulocytes: 0.02 K/uL (ref 0.00–0.07)
Basophils Absolute: 0 K/uL (ref 0.0–0.1)
Basophils Relative: 1 %
Eosinophils Absolute: 0.2 K/uL (ref 0.0–0.5)
Eosinophils Relative: 3 %
HCT: 39 % (ref 36.0–46.0)
Hemoglobin: 13.1 g/dL (ref 12.0–15.0)
Immature Granulocytes: 0 %
Lymphocytes Relative: 30 %
Lymphs Abs: 2.2 K/uL (ref 0.7–4.0)
MCH: 30.3 pg (ref 26.0–34.0)
MCHC: 33.6 g/dL (ref 30.0–36.0)
MCV: 90.3 fL (ref 80.0–100.0)
Monocytes Absolute: 0.5 K/uL (ref 0.1–1.0)
Monocytes Relative: 8 %
Neutro Abs: 4.2 K/uL (ref 1.7–7.7)
Neutrophils Relative %: 58 %
Platelets: 258 K/uL (ref 150–400)
RBC: 4.32 MIL/uL (ref 3.87–5.11)
RDW: 12.7 % (ref 11.5–15.5)
WBC: 7.1 K/uL (ref 4.0–10.5)
nRBC: 0 % (ref 0.0–0.2)

## 2023-08-28 LAB — URINALYSIS, ROUTINE W REFLEX MICROSCOPIC
Bacteria, UA: NONE SEEN
Bilirubin Urine: NEGATIVE
Glucose, UA: NEGATIVE mg/dL
Hgb urine dipstick: NEGATIVE
Ketones, ur: NEGATIVE mg/dL
Nitrite: NEGATIVE
Protein, ur: NEGATIVE mg/dL
Specific Gravity, Urine: 1.011 (ref 1.005–1.030)
pH: 7 (ref 5.0–8.0)

## 2023-08-28 LAB — TROPONIN T, HIGH SENSITIVITY
Troponin T High Sensitivity: <15 ng/L (ref <19)
Troponin T High Sensitivity: <15 ng/L (ref <19)

## 2023-08-28 MED ORDER — SODIUM CHLORIDE 0.9 % IV BOLUS
500.0000 mL | Freq: Once | INTRAVENOUS | Status: AC
  Administered 2023-08-28: 500 mL via INTRAVENOUS

## 2023-08-28 MED ORDER — LISINOPRIL 10 MG PO TABS
20.0000 mg | ORAL_TABLET | Freq: Once | ORAL | Status: AC
  Administered 2023-08-28: 20 mg via ORAL
  Filled 2023-08-28: qty 2
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
## 2023-08-28 NOTE — Discharge Instructions (Signed)
 Todos sus resultados estuvieron normales hoy.   Haga una cita con su doctor de cabezera para ser reevaluada si no se siente bien.

## 2023-08-28 NOTE — ED Notes (Signed)
 I have been unable to start IV. My colleague is attempting IV access as I write this.

## 2023-11-04 ENCOUNTER — Encounter: Payer: Self-pay | Admitting: Physician Assistant

## 2024-01-17 ENCOUNTER — Ambulatory Visit: Admitting: Physician Assistant

## 2024-01-17 ENCOUNTER — Ambulatory Visit

## 2024-01-19 ENCOUNTER — Ambulatory Visit: Admitting: Physician Assistant

## 2024-01-19 ENCOUNTER — Ambulatory Visit

## 2024-01-19 ENCOUNTER — Encounter: Payer: Self-pay | Admitting: Physician Assistant

## 2024-01-19 ENCOUNTER — Other Ambulatory Visit

## 2024-01-19 VITALS — BP 126/77 | HR 72 | Resp 20 | Ht 62.0 in | Wt 206.0 lb

## 2024-01-19 DIAGNOSIS — R413 Other amnesia: Secondary | ICD-10-CM | POA: Diagnosis not present

## 2024-01-19 NOTE — Progress Notes (Signed)
 Memory impairment of unclear etiology, concern for Alzheimer's disease and other etiologies  Klani Meyer is a very pleasant 73 y.o. year old RH female with a history of hypertension, hyperlipidemia, vitamin D, B12 deficiency, prediabetes with peripheral neuropathy, chronic dizziness, depression, OSA non compliant with CPAP, seen today for evaluation of memory loss. MoCA today is 10/30.  The patient is accompanied by her daughter  who supplements  the history. She needs an assistance with her ADLs, does not drive. Etiology of her memory loss is unclear, although she was amnestic and repetitive, concern for Alzheimer disease.  In addition, the patient is quite socially isolated, which along with longstanding depression, may contribute to her memory loss.  Follow-up in 6 months May entertain [facility] after MRI brain  MRI brain to determine any structural abnormalities and vascular load Recommend using the CPAP for OSA Agree with psychotherapy-psychiatry for the treatment of depression Increase socialization Consider assisted living for social, cognitive stimulation and safety Recommend good control of cardiovascular risk factors  Discussed the use of AI scribe software for clinical note transcription with the patient, who gave verbal consent to proceed.  History of Present Illness Shelly Meyer is a 73 year old female with depression who presents with memory concerns.    She has been experiencing memory problems for approximately 1 year, worse over the last two months, characterized by forgetfulness, misplacing items like keys, and difficulty recalling recent conversations or appointments.  She also experiences moments of disorientation upon waking, but she does not recall where she is.  No history of epilepsy, or hallucinations is reported.   She has a long-standing history of depression, diagnosed approximately twenty years ago, with previous treatment while in [place]  including medication and  a brief hospitalization for major depressive episode. She currently lives alone and feels isolated.  Her sleep is disrupted, often waking up around 5 AM and unable to return to sleep. No nightmares or vivid dreams are reported. She has a history of sleep apnea but does not use her CPAP machine due to discomfort with the mask.  She manages her medications with the help of her daughter, who organizes them in a weekly pillbox. She does not drive and has never driven.  No recent falls, she had a significant fall down stairs approximately seventeen years ago, resulting in a occipital injury, and a more recent mechanical fall at home about six months ago, injuring her hip, no LOC or head injury.  No history of stroke or strokelike symptoms  She reports a normal appetite and no significant changes in eating habits, although she prefers simple meals and has lost interest in cooking elaborate dishes. No issues with swallowing are reported.  She has a history of cataract surgery.  Her last eye examination was earlier this year, and she is scheduled for a follow-up in February.   No family history of dementia is reported, and there are no issues with incontinence or bowel habits. She does not smoke or consume alcohol.  Retired from attendance, third grade education.    Allergies[1]  Current Outpatient Medications  Medication Instructions   gabapentin  (NEURONTIN ) 100-300 mg, Oral, Daily at bedtime   lisinopril  (ZESTRIL ) 20 MG tablet TAKE 1 TABLET(20 MG) BY MOUTH DAILY   nystatin  cream (MYCOSTATIN ) Topical, 2 times daily, Apply to affected area 2 times daily until better     VITALS:   Vitals:   01/19/24 0838  BP: 126/77  Pulse: 72  Resp: 20  SpO2: 95%  Weight: 206 lb (93.4 kg)  Height: 5' 2 (1.575 m)     Neurological Exam     01/19/2024    9:00 AM  Montreal Cognitive Assessment   Visuospatial/ Executive (0/5) 2  Naming (0/3) 2  Attention: Read list of digits (0/2) 0  Attention:  Read list of letters (0/1) 0  Attention: Serial 7 subtraction starting at 100 (0/3) 0  Language: Repeat phrase (0/2) 2  Language : Fluency (0/1) 0  Abstraction (0/2) 2  Delayed Recall (0/5) 0  Orientation (0/6) 1  Total 9  Adjusted Score (based on education) 10        No data to display             Orientation:  Alert and oriented to person, not to place or to time. No aphasia or dysarthria. Fund of knowledge is appropriate. Recent and remote memory impaired.  Attention and concentration are reduced.  Able to name objects 2/3 and repeat phrases. Delayed recall  0/5 .  Cranial nerves: There is good facial symmetry. Extraocular muscles are intact and visual fields are full to confrontational testing. Speech is fluent and clear. No tongue deviation. Hearing is intact to conversational tone.  Tone: Tone is good throughout. Sensation: Sensation is intact to light touch.  Vibration is intact at the bilateral big toe.  Coordination: The patient has no difficulty with RAM's or FNF bilaterally. Normal finger to nose  Motor: Strength is 5/5 in the bilateral upper and lower extremities. There is no pronator drift. There are no fasciculations noted. DTR's: Deep tendon reflexes are 2/4 bilaterally. Gait and Station: The patient is able to ambulate without difficulty. Gait is cautious and narrow. Stride length is normal.        Thank you for allowing us  the opportunity to participate in the care of this nice patient. Please do not hesitate to contact us  for any questions or concerns.   Total time spent on today's visit was 60 minutes dedicated to this patient today, preparing to see patient, examining the patient, ordering tests and/or medications and counseling the patient, documenting clinical information in the EHR or other health record, independently interpreting results and communicating results to the patient/family, discussing treatment and goals, answering patient's questions and  coordinating care.  Cc:  Francyne Romano, MD  Camie Sevin 01/19/2024 10:29 AM       [1] No Known Allergies

## 2024-01-20 ENCOUNTER — Ambulatory Visit: Payer: Self-pay | Admitting: Physician Assistant

## 2024-01-20 LAB — VITAMIN B12: Vitamin B-12: 332 pg/mL (ref 200–1100)

## 2024-01-20 LAB — TSH: TSH: 2.71 m[IU]/L (ref 0.40–4.50)

## 2024-01-31 ENCOUNTER — Ambulatory Visit
Admission: RE | Admit: 2024-01-31 | Discharge: 2024-01-31 | Disposition: A | Discharge status: Home / Self Care | Source: Ambulatory Visit | Attending: Physician Assistant | Admitting: Physician Assistant

## 2024-07-19 ENCOUNTER — Ambulatory Visit: Payer: Self-pay | Admitting: Physician Assistant
